# Patient Record
Sex: Male | Born: 1935 | Race: White | Hispanic: No | Marital: Married | Smoking: Former smoker
Health system: Southern US, Community
[De-identification: ages and names within clinical notes are randomized; demographics above are authoritative.]

## PROBLEM LIST (undated history)

## (undated) DIAGNOSIS — M1990 Unspecified osteoarthritis, unspecified site: Secondary | ICD-10-CM

## (undated) DIAGNOSIS — E785 Hyperlipidemia, unspecified: Secondary | ICD-10-CM

## (undated) DIAGNOSIS — M545 Low back pain, unspecified: Secondary | ICD-10-CM

## (undated) DIAGNOSIS — G629 Polyneuropathy, unspecified: Secondary | ICD-10-CM

## (undated) DIAGNOSIS — F411 Generalized anxiety disorder: Secondary | ICD-10-CM

## (undated) DIAGNOSIS — F32A Depression, unspecified: Secondary | ICD-10-CM

## (undated) DIAGNOSIS — I10 Essential (primary) hypertension: Secondary | ICD-10-CM

## (undated) DIAGNOSIS — M79605 Pain in left leg: Secondary | ICD-10-CM

## (undated) DIAGNOSIS — K219 Gastro-esophageal reflux disease without esophagitis: Secondary | ICD-10-CM

## (undated) DIAGNOSIS — G8929 Other chronic pain: Secondary | ICD-10-CM

## (undated) DIAGNOSIS — M79604 Pain in right leg: Secondary | ICD-10-CM

## (undated) HISTORY — DX: Low back pain: M54.5

## (undated) HISTORY — DX: Low back pain, unspecified: M54.50

## (undated) HISTORY — DX: Unspecified osteoarthritis, unspecified site: M19.90

## (undated) HISTORY — DX: Essential (primary) hypertension: I10

## (undated) HISTORY — PX: NOSE SURGERY: SHX723

## (undated) HISTORY — PX: EXTERNAL EAR SURGERY: SHX627

## (undated) HISTORY — DX: Gastro-esophageal reflux disease without esophagitis: K21.9

## (undated) HISTORY — DX: Hyperlipidemia, unspecified: E78.5

## (undated) HISTORY — PX: DECUBITUS ULCER EXCISION: SHX1443

## (undated) HISTORY — DX: Other chronic pain: G89.29

## (undated) HISTORY — PX: HEMORRHOID SURGERY: SHX153

## (undated) HISTORY — PX: CERVICAL FUSION: SHX112

## (undated) HISTORY — DX: Pain in right leg: M79.604

## (undated) HISTORY — DX: Polyneuropathy, unspecified: G62.9

## (undated) HISTORY — DX: Generalized anxiety disorder: F41.1

## (undated) HISTORY — PX: KNEE SURGERY: SHX244

## (undated) HISTORY — PX: ROTATOR CUFF REPAIR: SHX139

## (undated) HISTORY — DX: Pain in left leg: M79.605

---

## 1999-09-29 ENCOUNTER — Emergency Department (HOSPITAL_COMMUNITY): Admission: EM | Admit: 1999-09-29 | Discharge: 1999-09-29 | Payer: Self-pay | Admitting: Emergency Medicine

## 1999-09-29 ENCOUNTER — Encounter: Payer: Self-pay | Admitting: Emergency Medicine

## 1999-10-11 ENCOUNTER — Encounter: Payer: Self-pay | Admitting: Family Medicine

## 1999-10-11 ENCOUNTER — Ambulatory Visit (HOSPITAL_COMMUNITY): Admission: RE | Admit: 1999-10-11 | Discharge: 1999-10-11 | Payer: Self-pay | Admitting: Family Medicine

## 1999-10-21 ENCOUNTER — Encounter: Payer: Self-pay | Admitting: Neurosurgery

## 1999-10-21 ENCOUNTER — Encounter: Admission: RE | Admit: 1999-10-21 | Discharge: 1999-10-21 | Payer: Self-pay | Admitting: Neurosurgery

## 2000-02-03 ENCOUNTER — Ambulatory Visit (HOSPITAL_COMMUNITY): Admission: RE | Admit: 2000-02-03 | Discharge: 2000-02-03 | Payer: Self-pay | Admitting: Neurosurgery

## 2000-02-03 ENCOUNTER — Encounter: Payer: Self-pay | Admitting: Neurosurgery

## 2000-11-15 ENCOUNTER — Encounter: Payer: Self-pay | Admitting: Neurosurgery

## 2000-11-15 ENCOUNTER — Inpatient Hospital Stay (HOSPITAL_COMMUNITY): Admission: RE | Admit: 2000-11-15 | Discharge: 2000-11-17 | Payer: Self-pay | Admitting: Neurosurgery

## 2000-12-01 ENCOUNTER — Encounter: Admission: RE | Admit: 2000-12-01 | Discharge: 2000-12-01 | Payer: Self-pay | Admitting: Neurosurgery

## 2000-12-01 ENCOUNTER — Encounter: Payer: Self-pay | Admitting: Neurosurgery

## 2001-01-02 ENCOUNTER — Encounter: Payer: Self-pay | Admitting: Neurosurgery

## 2001-01-02 ENCOUNTER — Encounter: Admission: RE | Admit: 2001-01-02 | Discharge: 2001-01-02 | Payer: Self-pay | Admitting: Neurosurgery

## 2001-08-07 ENCOUNTER — Encounter: Payer: Self-pay | Admitting: Specialist

## 2001-08-07 ENCOUNTER — Ambulatory Visit (HOSPITAL_COMMUNITY): Admission: RE | Admit: 2001-08-07 | Discharge: 2001-08-07 | Payer: Self-pay | Admitting: Specialist

## 2001-08-21 ENCOUNTER — Inpatient Hospital Stay (HOSPITAL_COMMUNITY): Admission: RE | Admit: 2001-08-21 | Discharge: 2001-08-22 | Payer: Self-pay | Admitting: Specialist

## 2002-08-15 ENCOUNTER — Encounter: Payer: Self-pay | Admitting: Specialist

## 2002-08-15 ENCOUNTER — Ambulatory Visit (HOSPITAL_COMMUNITY): Admission: RE | Admit: 2002-08-15 | Discharge: 2002-08-15 | Payer: Self-pay | Admitting: Specialist

## 2002-10-01 ENCOUNTER — Encounter: Payer: Self-pay | Admitting: Specialist

## 2002-10-08 ENCOUNTER — Ambulatory Visit (HOSPITAL_COMMUNITY): Admission: RE | Admit: 2002-10-08 | Discharge: 2002-10-08 | Payer: Self-pay | Admitting: Specialist

## 2002-10-08 ENCOUNTER — Encounter (INDEPENDENT_AMBULATORY_CARE_PROVIDER_SITE_OTHER): Payer: Self-pay | Admitting: Specialist

## 2006-04-15 ENCOUNTER — Ambulatory Visit: Payer: Self-pay | Admitting: Gastroenterology

## 2006-04-29 ENCOUNTER — Encounter (INDEPENDENT_AMBULATORY_CARE_PROVIDER_SITE_OTHER): Payer: Self-pay | Admitting: Specialist

## 2006-04-29 ENCOUNTER — Ambulatory Visit: Payer: Self-pay | Admitting: Gastroenterology

## 2006-04-29 ENCOUNTER — Encounter (INDEPENDENT_AMBULATORY_CARE_PROVIDER_SITE_OTHER): Payer: Self-pay | Admitting: *Deleted

## 2009-10-13 ENCOUNTER — Encounter (INDEPENDENT_AMBULATORY_CARE_PROVIDER_SITE_OTHER): Payer: Self-pay | Admitting: *Deleted

## 2009-10-13 ENCOUNTER — Emergency Department (HOSPITAL_BASED_OUTPATIENT_CLINIC_OR_DEPARTMENT_OTHER): Admission: EM | Admit: 2009-10-13 | Discharge: 2009-10-13 | Payer: Self-pay | Admitting: Emergency Medicine

## 2009-10-13 ENCOUNTER — Ambulatory Visit: Payer: Self-pay | Admitting: Diagnostic Radiology

## 2009-10-20 ENCOUNTER — Encounter (INDEPENDENT_AMBULATORY_CARE_PROVIDER_SITE_OTHER): Payer: Self-pay | Admitting: *Deleted

## 2009-10-31 ENCOUNTER — Encounter (INDEPENDENT_AMBULATORY_CARE_PROVIDER_SITE_OTHER): Payer: Self-pay | Admitting: *Deleted

## 2009-10-31 ENCOUNTER — Ambulatory Visit: Payer: Self-pay | Admitting: Gastroenterology

## 2009-10-31 DIAGNOSIS — M1990 Unspecified osteoarthritis, unspecified site: Secondary | ICD-10-CM | POA: Insufficient documentation

## 2009-10-31 DIAGNOSIS — R197 Diarrhea, unspecified: Secondary | ICD-10-CM

## 2009-10-31 DIAGNOSIS — K219 Gastro-esophageal reflux disease without esophagitis: Secondary | ICD-10-CM

## 2009-10-31 DIAGNOSIS — K5730 Diverticulosis of large intestine without perforation or abscess without bleeding: Secondary | ICD-10-CM | POA: Insufficient documentation

## 2009-11-17 ENCOUNTER — Ambulatory Visit: Payer: Self-pay | Admitting: Gastroenterology

## 2009-11-20 ENCOUNTER — Encounter: Payer: Self-pay | Admitting: Gastroenterology

## 2009-12-01 ENCOUNTER — Telehealth: Payer: Self-pay | Admitting: Gastroenterology

## 2009-12-01 ENCOUNTER — Ambulatory Visit: Payer: Self-pay | Admitting: Internal Medicine

## 2009-12-01 DIAGNOSIS — K2990 Gastroduodenitis, unspecified, without bleeding: Secondary | ICD-10-CM

## 2009-12-01 DIAGNOSIS — R109 Unspecified abdominal pain: Secondary | ICD-10-CM

## 2009-12-01 DIAGNOSIS — K2970 Gastritis, unspecified, without bleeding: Secondary | ICD-10-CM | POA: Insufficient documentation

## 2009-12-01 DIAGNOSIS — F411 Generalized anxiety disorder: Secondary | ICD-10-CM | POA: Insufficient documentation

## 2010-05-28 NOTE — Procedures (Signed)
Summary: Colon and path   Colonoscopy  Procedure date:  04/29/2006  Findings:      Location:  [facility].    Colonoscopy  Procedure date:  04/29/2006  Findings:      Location:  [facility].   Patient Name: [name], [name] MRN:  Procedure Procedures: Colonoscopy CPT: 04540.  Personnel: Endoscopist: Ulyess Mort, MD.  Exam Location: Exam performed in Outpatient Clinic. Outpatient  Patient Consent: Procedure, Alternatives, Risks and Benefits discussed, consent obtained, from patient. Consent was obtained by the RN.  Indications  Surveillance of: Adenomatous Polyp(s).  History  Current Medications: Patient is not currently taking Coumadin.  Pre-Exam Physical: Performed Sep 04, 2002. Cardio-pulmonary exam, Rectal exam, HEENT exam , Abdominal exam, Extremity exam, Mental status exam WNL.  Comments: Pt. history reviewed/updated, physical exam performed prior to initiation of sedation? Exam Exam: Extent of exam reached: Cecum, extent intended: Cecum.  The cecum was identified by appendiceal orifice and IC valve. Colon retroflexion performed. Images taken. ASA Classification: II. Tolerance: good.  Monitoring: Pulse and BP monitoring, Oximetry used. Supplemental O2 given.  Sedation Meds: Patient assessed and found to be appropriate for moderate (conscious) sedation. Fentanyl given IV. Versed given IV.  Findings - DIVERTICULOSIS: Transverse Colon to Sigmoid Colon. ICD9: Diverticulosis, Colon: 562.10. Comments: mild-mod.  POLYP: Descending Colon, Maximum size: 4 mm. sessile polyp. Procedure:  hot biopsy, removed, retrieved, Polyp sent to pathology. ICD9: Colon Polyps: 211.3.  - HEMORRHOIDS: External. Size: Grade I. ICD9: Hemorrhoids, External: 455.3.   Assessment Abnormal examination, see findings above.  Diagnoses: 211.3: Colon Polyps.  562.10: Diverticulosis, Colon.  455.3: Hemorrhoids, External.   Events  Unplanned  Interventions: No intervention was required.  Unplanned Events: There were no complications. Plans Medication Plan: Continue current medications.  Patient Education: Patient given standard instructions for: Polyps. Diverticulosis. Hemorrhoids. Patient instructed to get routine colonoscopy every 4 years.  Disposition: After procedure patient sent to recovery. After recovery patient sent home.    cc; [name], MD      [facility] Surgical Pathology - STATUS: Final             By: Threasa Beards  ,        Perform Date: 4 Jan08 00:01  Ordered By: Dorene Ar,        Ordered Date: 7 Jan08 12:43  Facility: [facility]                               Department: CPATH  Service Report Text  [facility]   [address] [address]   [place], [place] [address]   Telephone [phone] or [phone] Fax [phone]    REPORT OF SURGICAL PATHOLOGY    Case #: [id number]   Patient Name: [name].   Office Chart Number: [id number]    MRN: [id number]   Pathologist: Havery Moros, MD   DOB/Age 04/24/1936 (Age: 75) Gender: M   Date Taken: 04/29/2006   Date Received: 05/02/2006    FINAL DIAGNOSIS    ***MICROSCOPIC EXAMINATION AND DIAGNOSIS***    DESCENDING COLON, POLYP(S): ADENOMATOUS POLYP(S). NO HIGH GRADE   DYSPLASIA OR INVASIVE MALIGNANCY IDENTIFIED.    mw   Date Reported: 05/03/2006 Havery Moros, MD   *** Electronically Signed Out By BNS ***    Clinical information   R/O adenoma (caf)    specimen(s) obtained   Colon, polyp(s), descending  Gross Description   Received in formalin is a tan, soft tissue fragment that is   submitted in toto. Size: 0.3 cm (TB:mj 05/02/06)    mj/    This report was created from the original endoscopy report, which was reviewed and signed by the above listed endoscopist.

## 2010-05-28 NOTE — Letter (Signed)
Summary: New Patient letter  [name]  [address] [place], [place] [address]   Phone: [phone]  Fax: [phone]       10/20/2009 MRN: [id number]  [name] [address] [place], [place]  [address]  Dear [name],  Welcome to the Gastroenterology Division at [facility].    You are scheduled to see Dr.  Sheryn Bison on October 31, 2009 at 9:30am on the 3rd floor at [facility], [address]  We ask that you try to arrive at our office 15 minutes prior to your appointment time to allow for check-in.  We would like you to complete the enclosed self-administered evaluation form prior to your visit and bring it with you on the day of your appointment.  We will review it with you.  Also, please bring a complete list of all your medications or, if you prefer, bring the medication bottles and we will list them.  Please bring your insurance card so that we may make a copy of it.  If your insurance requires a referral to see a specialist, please bring your referral form from your primary care physician.  Co-payments are due at the time of your visit and may be paid by cash, check or credit card.     Your office visit will consist of a consult with your physician (includes a physical exam), any laboratory testing he/she may order, scheduling of any necessary diagnostic testing (e.g. x-ray, ultrasound, CT-scan), and scheduling of a procedure (e.g. Endoscopy, Colonoscopy) if required.  Please allow enough time on your schedule to allow for any/all of these possibilities.    If you cannot keep your appointment, please call [phone] to cancel or reschedule prior to your appointment date.  This allows [place] the opportunity to schedule an appointment for another patient in need of care.  If you do not cancel or reschedule by 5 p.m. the business day prior to your appointment date, you will be charged a $50.00 late cancellation/no-show fee.    Thank you for  choosing [facility] for your medical needs.  We appreciate the opportunity to care for you.  Please visit [place] at our website  to learn more about our practice.                     Sincerely,                                                             The Gastroenterology Division

## 2010-05-28 NOTE — Letter (Signed)
Summary: Patient Notice- Polyp Results  [facility]  [address] [place], [place] [address]   Phone: [phone]  Fax: [phone]        November 20, 2009 MRN: [id number]    [name] [address] [place], [place]  [address]    Dear [name],  I am pleased to inform you that the colon polyp(s) removed during your recent colonoscopy was (were) found to be benign (no cancer detected) upon pathologic examination.  I recommend you have a repeat colonoscopy examination in 5_ years to look for recurrent polyps, as having colon polyps increases your risk for having recurrent polyps or even colon cancer in the future.  Should you develop new or worsening symptoms of abdominal pain, bowel habit changes or bleeding from the rectum or bowels, please schedule an evaluation with either your primary care physician or with me.  Additional information/recommendations:  __ No further action with gastroenterology is needed at this time. Please      follow-up with your primary care physician for your other healthcare      needs.  __ Please call [phone] to schedule a return visit to review your      situation.  __ Please keep your follow-up visit as already scheduled.  x__ Continue treatment plan as outlined the day of your exam.  Please call us if you are having persistent problems or have questions about your condition that have not been fully answered at this time.  Sincerely,  Mardella Layman MD [facility]  This letter has been electronically signed by your physician.  Appended Document: Patient Notice- Polyp Results letter mailed 8.1.2011

## 2010-05-28 NOTE — Letter (Signed)
Summary: [facility] Instructions  [facility]  [address] [place], [place] [address]   Phone: [phone]  Fax: [phone]       [name]    07/03/1935    MRN: [id number]        Procedure Day /Date: Monday, 11/17/09     Arrival Time: 1:00      Procedure Time: 2:00     Location of Procedure:                    [facility] (4th Floor)   PREPARATION FOR COLONOSCOPY WITH MOVIPREP   Starting 5 days prior to your procedure 11/12/09 do not eat nuts, seeds, popcorn, corn, beans, peas,  salads, or any raw vegetables.  Do not take any fiber supplements (e.g. Metamucil, Citrucel, and Benefiber).  THE DAY BEFORE YOUR PROCEDURE         DATE: 11/16/09    DAY: Sunday  1.  Drink clear liquids the entire day-NO SOLID FOOD  2.  Do not drink anything colored red or purple.  Avoid juices with pulp.  No orange juice.  3.  Drink at least 64 oz. (8 glasses) of fluid/clear liquids during the day to prevent dehydration and help the prep work efficiently.  CLEAR LIQUIDS INCLUDE: Water Jello Ice Popsicles Tea (sugar ok, no milk/cream) Powdered fruit flavored drinks Coffee (sugar ok, no milk/cream) Gatorade Juice: apple, white grape, white cranberry  Lemonade Clear bullion, consomm, broth Carbonated beverages (any kind) Strained chicken noodle soup Hard Candy                             4.  In the morning, mix first dose of MoviPrep solution:    Empty 1 Pouch A and 1 Pouch B into the disposable container    Add lukewarm drinking water to the top line of the container. Mix to dissolve    Refrigerate (mixed solution should be used within 24 hrs)  5.  Begin drinking the prep at 5:00 p.m. The MoviPrep container is divided by 4 marks.   Every 15 minutes drink the solution down to the next mark (approximately 8 oz) until the full liter is complete.   6.  Follow completed prep with 16 oz of clear liquid of your choice (Nothing red or purple).  Continue to  drink clear liquids until bedtime.  7.  Before going to bed, mix second dose of MoviPrep solution:    Empty 1 Pouch A and 1 Pouch B into the disposable container    Add lukewarm drinking water to the top line of the container. Mix to dissolve    Refrigerate  THE DAY OF YOUR PROCEDURE      DATE: 11/17/09   DAY: Monday  Beginning at 9:00 a.m. (5 hours before procedure):         1. Every 15 minutes, drink the solution down to the next mark (approx 8 oz) until the full liter is complete.  2. Follow completed prep with 16 oz. of clear liquid of your choice.    3. You may drink clear liquids until 12:00 (2 HOURS BEFORE PROCEDURE).   MEDICATION INSTRUCTIONS  Unless otherwise instructed, you should take regular prescription medications with a small sip of water   as early as possible the morning of your procedure.               OTHER INSTRUCTIONS  You will need a responsible adult at least 73 years of age to accompany you and drive you home.   This person must remain in the waiting room during your procedure.  Wear loose fitting clothing that is easily removed.  Leave jewelry and other valuables at home.  However, you may wish to bring a book to read or  an iPod/MP3 player to listen to music as you wait for your procedure to start.  Remove all body piercing jewelry and leave at home.  Total time from sign-in until discharge is approximately 2-3 hours.  You should go home directly after your procedure and rest.  You can resume normal activities the  day after your procedure.  The day of your procedure you should not:   Drive   Make legal decisions   Operate machinery   Drink alcohol   Return to work  You will receive specific instructions about eating, activities and medications before you leave.    The above instructions have been reviewed and explained to me by   _______________________    I fully understand and can verbalize these instructions  _____________________________ Date _________

## 2010-05-28 NOTE — Miscellaneous (Signed)
Summary: Orders Update/clotest  Clinical Lists Changes  Orders: Added new Test order of TLB-H Pylori Screen Gastric Biopsy (83013-CLOTEST) - Signed 

## 2010-05-28 NOTE — Progress Notes (Signed)
Summary: Abd Pain  Phone Note Call from Patient Call back at Home Phone [phone]   Call For: Dr Jarold Motto Reason for Call: Talk to Nurse Summary of Call: Abd Pain since friday will not go away Initial call taken by: Leanor Kail [facility],  December 01, 2009 10:26 AM  Follow-up for Phone Call        Pt c/o RUQ pain since Friday.  Crampy pain, severe at times.  OV sch for pt to see Rozetta Nunnery this pm. Follow-up by: Ashok Cordia RN,  December 01, 2009 10:39 AM

## 2010-05-28 NOTE — Letter (Signed)
Summary: Patient Notice-Endo Biopsy Results  [facility]  [address] [place], [place] [address]   Phone: [phone]  Fax: [phone]        November 20, 2009 MRN: [id number]    [name] [address] [place], [place]  [address]    Dear Mr. OSORIA,  I am pleased to inform you that the biopsies taken during your recent endoscopic examination did not show any evidence of cancer upon pathologic examination.  Additional information/recommendations:  __No further action is needed at this time.  Please follow-up with      your primary care physician for your other healthcare needs.  __ Please call [phone] to schedule a return visit to review      your condition.  __ Continue with the treatment plan as outlined on the day of your      exam.Prevpac called for helicobacter Rx.and chronic gastritis.  __ You should have a repeat endoscopic examination for this problem              in _ months/years.   Please call us if you are having persistent problems or have questions about your condition that have not been fully answered at this time.  Sincerely,  Mardella Layman MD [facility]  This letter has been electronically signed by your physician.  Appended Document: Patient Notice-Endo Biopsy Results letter mailed 8.1.2011

## 2010-05-28 NOTE — Assessment & Plan Note (Signed)
Summary: ABD PAIN, CHRONIC DARRHEA...EM   History of Present Illness Visit Type: consult  Primary GI MD: Sheryn Bison MD FACP FAGA Primary Provider: [name], MD Requesting Provider: [name], MD Chief Complaint: Lower abd pain, constipation, rectal bleeding, and GERD History of Present Illness:   75 year old Caucasian male who had seen the past for screening colonoscopy with removal of some hyperplastic polyps. Also that time he had mild left colon diverticulosis.  He is referred now for evaluation one month of crampy lower abdominal pain with loose watery diarrhea, all of which has resolved with Lomotil therapy. In fact, he is now constipated and has not had a bowel movement in 3 days.  Patient initially had right upper quadrant pain, was seen by primary care, and had a negative ultrasound of his abdomen. He subsequent developed crampy diffuse abdominal pain and watery nonbloody diarrhea and was seen in emergency room where he had negative labs, examination, CT scan, and multiple stool exams are all negative except for positive leukocytes in his stool. There are voluminous records that were reviewed today and in some detail but which is very time-consuming completing his radiographs. Liver function tests are normal except for total bilirubin of 1.6 mg percent. There was no evidence of leukocytosis. CT Scan did show diverticulosis. Chest x-ray, EKG, and cardiac enzymes also were normal.  The patient is a low fiber diet at this time but denies dietary sensitivities. He denies chronic diarrhea, systemic complaints such as fever, chills, or skin rash, he also currently denies upper GI or hepatobiliary symptomatology. He has not been on any antibiotics in the last year, but his wife was sick earlier this spring with a myocardial infarction, and apparently he visited her multiple times in the hospital.There is a history of DJD and hyperlipidemia but no NSAID use. He is on aspirin 81 mg a day. He  currently is on Prilosec for periodic acid reflux, but he denies dysphasia or active symptomatology. For his DJD he has had previous knee surgery and neck surgery.   GI Review of Systems    Reports abdominal pain, acid reflux, and  heartburn.     Location of  Abdominal pain: lower abdomen.    Denies belching, bloating, chest pain, dysphagia with liquids, dysphagia with solids, loss of appetite, nausea, vomiting, vomiting blood, weight loss, and  weight gain.      Reports constipation, diarrhea, diverticulosis, rectal bleeding, and  rectal pain.     Denies anal fissure, black tarry stools, change in bowel habit, fecal incontinence, heme positive stool, hemorrhoids, irritable bowel syndrome, jaundice, light color stool, and  liver problems.    Current Medications (verified): 1)  Aspirin 81 Mg Tbec (Aspirin) .... One Tablet By Mouth Once Daily(On Hold) 2)  Metoprolol Tartrate 50 Mg Tabs (Metoprolol Tartrate) .... Two Times A Day 3)  Lovaza 1 Gm Caps (Omega-3-Acid Ethyl Esters) .... 2 Tabs Two Times A Day 4)  Pravachol 20 Mg Tabs (Pravastatin Sodium) .... One Tablet By Mouth Once Daily 5)  Diazepam 5 Mg Tabs (Diazepam) .... One Tablet By Mouth Three Times A Day 6)  Prilosec Otc 20 Mg Tbec (Omeprazole Magnesium) .... One Tablet By Mouth Once Daily 7)  Pepcid Ac 10 Mg Tabs (Famotidine) .... One Tablet By Mouth Once Daily  Allergies (verified): 1)  ! Prednisone  Past History:  Past medical, surgical, family and social histories (including risk factors) reviewed for relevance to current acute and chronic problems.  Past Medical History: Anxiety Disorder Arthritis Hyperlipidemia  Anal Fissure  Colon Polyps Diverticulosis Esophageal Stricture  Past Surgical History: Neck Surgery Right Knee Surgery x 3  Shoulder Surgery   Family History: Reviewed history and no changes required. No FH of Colon Cancer:  Social History: Reviewed history and no changes required. Retired Married One  child Alcohol Use - no Daily Caffeine Use: one daily  Illicit Drug Use - no Drug Use:  no  Review of Systems       The patient complains of back pain, fatigue, and hearing problems.  The patient denies allergy/sinus, anemia, anxiety-new, arthritis/joint pain, blood in urine, breast changes/lumps, change in vision, confusion, cough, coughing up blood, depression-new, fainting, fever, headaches-new, heart murmur, heart rhythm changes, itching, menstrual pain, muscle pains/cramps, night sweats, nosebleeds, pregnancy symptoms, shortness of breath, skin rash, sleeping problems, sore throat, swelling of feet/legs, swollen lymph glands, thirst - excessive , urination - excessive , urination changes/pain, urine leakage, vision changes, and voice change.   General:  Denies fever, chills, sweats, anorexia, fatigue, weakness, malaise, weight loss, and sleep disorder. Eyes:  Denies blurring, diplopia, irritation, discharge, vision loss, scotoma, eye pain, and photophobia. ENT:  Complains of decreased hearing; denies earache, ear discharge, tinnitus, nasal congestion, loss of smell, nosebleeds, sore throat, hoarseness, and difficulty swallowing. CV:  Denies chest pains, angina, palpitations, syncope, dyspnea on exertion, orthopnea, PND, peripheral edema, and claudication. Resp:  Denies dyspnea at rest, dyspnea with exercise, cough, sputum, wheezing, coughing up blood, and pleurisy. GI:  Complains of constipation; denies difficulty swallowing, pain on swallowing, nausea, indigestion/heartburn, vomiting, vomiting blood, abdominal pain, jaundice, gas/bloating, diarrhea, change in bowel habits, bloody BM's, black BMs, and fecal incontinence. GU:  Denies urinary burning, blood in urine, urinary frequency, urinary hesitancy, nocturnal urination, urinary incontinence, penile discharge, genital sores, decreased libido, and erectile dysfunction. MS:  Complains of joint swelling, joint stiffness, and joint deformity;  denies joint pain / LOM, low back pain, muscle weakness, muscle cramps, muscle atrophy, leg pain at night, leg pain with exertion, and shoulder pain / LOM hand / wrist pain (CTS). Derm:  Denies rash, itching, dry skin, hives, moles, warts, and unhealing ulcers. Neuro:  Denies weakness, paralysis, abnormal sensation, seizures, syncope, tremors, vertigo, transient blindness, frequent falls, frequent headaches, difficulty walking, headache, sciatica, radiculopathy other:, restless legs, memory loss, and confusion. Psych:  Complains of anxiety; denies depression, memory loss, suicidal ideation, hallucinations, paranoia, phobia, and confusion. Endo:  Denies cold intolerance, heat intolerance, polydipsia, polyphagia, polyuria, unusual weight change, and hirsutism. Heme:  Denies bruising, bleeding, enlarged lymph nodes, and pagophagia. Allergy:  Denies hives, rash, sneezing, hay fever, and recurrent infections.  Vital Signs:  Patient profile:   75 year old male Height:      74 inches Weight:      259 pounds BMI:     33.37 BSA:     2.43 Pulse rate:   76 / minute Pulse rhythm:   regular BP sitting:   140 / 82  (left arm) Cuff size:   regular  Vitals Entered By: Ok Anis CMA (October 31, 2009 9:31 AM)  Physical Exam  General:  Well developed, well nourished, no acute distress.healthy appearing.   Head:  Normocephalic and atraumatic. Eyes:  PERRLA, no icterus.exam deferred to patient's ophthalmologist.   Neck:  Supple; no masses or thyromegaly. Lungs:  Clear throughout to auscultation. Heart:  Regular rate and rhythm; no murmurs, rubs,  or bruits. Abdomen:  Soft, nontender and nondistended. No masses, hepatosplenomegaly or hernias noted. Normal bowel sounds. Rectal:  Normal exam.hemocult negative.  normal color formed guaiac-negative stool. Prostate:  .normal size prostate.   Msk:  Symmetrical with no gross deformities. Normal posture.arthritic changes.  Swelling of both knees without effusions  or increased warmth or tenderness. There is no evidence of phlebitis. Pulses:  Normal pulses noted. Extremities:  No clubbing, cyanosis, edema or deformities noted. Neurologic:  Alert and  oriented x4;  grossly normal neurologically. Cervical Nodes:  No significant cervical adenopathy. Inguinal Nodes:  No significant inguinal adenopathy. Psych:  Alert and cooperative. Normal mood and affect.   Impression & Recommendations:  Problem # 1:  DIARRHEA (ICD-787.91) Assessment Improved Possible resolving viral or bacterial gastroenteritis versus low-grade diverticulitis. As mentioned above complete GI workup with stool exams, labs, ultrasound and CT scans have all been normal. Patient is currently asymptomatic using p.r.n. Lomotil. We will slowly advance his diet as tolerated and proceed with colonoscopy in several weeks time. Have asked him to discontinue Lomotil use at this time but to call if he has a relapse of his problems.Again, multiple x-rays and records reviewed today with the examination the patient with his wife and presents. Large amount of time spent reviewing his records and reviewing his problems.C. difficile toxin assay also was negative.  Problem # 2:  DIVERTICULOSIS-COLON (ICD-562.10) Assessment: Unchanged Advance fiber in his diet slowly as tolerated.  Problem # 3:  GERD (ICD-530.81) Assessment: Improved chronic GERD with chronic PPI therapy. I cannot series had previous endoscopy and we will schedule this with his colonoscopy. Review of his chart record shows chronic acid reflux problems with a history of previous stricture and dilation. He needs screening to rule out Barrett's mucosa.  Problem # 4:  DEGENERATIVE JOINT DISEASE (ICD-715.90) Assessment: Unchanged patient advised to avoid NSAIDs if possible. It is of note that he is on daily aspirin therapy.  Patient Instructions: 1)  Please continue current medications.  2)  You are scheduled for a colonoscopy. 3)  The  medication list was reviewed and reconciled.  All changed / newly prescribed medications were explained.  A complete medication list was provided to the patient / caregiver. 4)  Copy sent to : Dr. [name] 5)  Please continue current medications.  6)  Constipation and Hemorrhoids brochure given.  7)  Colonoscopy and Flexible Sigmoidoscopy brochure given.  8)  Upper Endoscopy brochure given.   Appended Document: ABD PAIN, CHRONIC DARRHEA...EM    Clinical Lists Changes  Medications: Added new medication of MOVIPREP 100 GM  SOLR (PEG-KCL-NACL-NASULF-NA ASC-C) As per prep instructions. - Signed Rx of MOVIPREP 100 GM  SOLR (PEG-KCL-NACL-NASULF-NA ASC-C) As per prep instructions.;  #1 x 0;  Signed;  Entered by: Ashok Cordia RN;  Authorized by: Mardella Layman MD [facility];  Method used: Electronically to [facility] [address] [place], [place], [place]  [address], Ph: [phone], Fax: [phone] Orders: Added new Test order of Colon/Endo (Colon/Endo) - Signed    Prescriptions: MOVIPREP 100 GM  SOLR (PEG-KCL-NACL-NASULF-NA ASC-C) As per prep instructions.  #1 x 0   Entered by:   Ashok Cordia RN   Authorized by:   Mardella Layman MD [facility]   Signed by:   Ashok Cordia RN on 10/31/2009   Method used:   Electronically to        [facility]* (retail)       [address]       [place], [place]  [address]  Ph: [phone]       Fax: [phone]   RxID:   [id number]

## 2010-05-28 NOTE — Procedures (Signed)
Summary: Upper Endoscopy  Patient: [name] Note: All result statuses are Final unless otherwise noted.  Tests: (1) Upper Endoscopy (EGD)   EGD Upper Endoscopy       DONE     [facility]     [address]     [place], [place]  [address]           ENDOSCOPY PROCEDURE REPORT           PATIENT:  [name], [name]  MR#:  [id number]     BIRTHDATE:  November 12, 1935, 74 yrs. old  GENDER:  male           ENDOSCOPIST:  Vania Rea. Jarold Motto, MD, [facility]     Referred by:           PROCEDURE DATE:  11/17/2009     PROCEDURE:  EGD with biopsy     ASA CLASS:  Class II     INDICATIONS:  GERD           MEDICATIONS:   There was residual sedation effect present from     prior procedure., Fentanyl 25 mcg IV, Versed 2 mg IV     TOPICAL ANESTHETIC:  Exactacain Spray           DESCRIPTION OF PROCEDURE:   After the risks benefits and     alternatives of the procedure were thoroughly explained, informed     consent was obtained.  The LB GIF-H180 [id number] endoscope was     introduced through the mouth and advanced to the second portion of     the duodenum, without limitations.  The instrument was slowly     withdrawn as the mucosa was fully examined.     <<PROCEDUREIMAGES>>           A stricture was found in the distal esophagus. It was     circumferential. Dilation with maloney dilator 18mm TOLERATED     WELL.  Moderate gastritis was found in the body and the antrum of     the stomach. CLO AND REGULAR BIOPSIES DONE.MILD DUODENITIS.     Retroflexed views revealed no abnormalities.    The scope was then     withdrawn from the patient and the procedure completed.           COMPLICATIONS:  None           ENDOSCOPIC IMPRESSION:     1) Stricture in the distal esophagus     2) Moderate gastritis in the body and the antrum of the stomach           1.R/O H.PYLORI     2.CHRONIC GERD AND STRICTURE DILATED.     RECOMMENDATIONS:     1) Await biopsy results     2) Clear liquids until, then soft foods rest  iof day. Resume     prior diet tomorrow.     3) Rx CLO if positive     4) continue current medications           REPEAT EXAM:  No           ______________________________     Vania Rea. Jarold Motto, MD, [facility]           CC:  [name], MD           n.     [name] at 11/17/2009 02:46 PM           [name], [phone]  Note: An  exclamation mark (!) indicates a result that was not dispersed into the flowsheet. Document Creation Date: 11/17/2009 2:47 PM _______________________________________________________________________  (1) Order result status: Final Collection or observation date-time: 11/17/2009 14:35 Requested date-time:  Receipt date-time:  Reported date-time:  Referring Physician:   Ordering Physician: Sheryn Bison [phone]) Specimen Source:  Source: Launa Grill Order Number: [id number] Lab site:

## 2010-05-28 NOTE — Procedures (Signed)
Summary: Colonoscopy  Patient: [name] Note: All result statuses are Final unless otherwise noted.  Tests: (1) Colonoscopy (COL)   COL Colonoscopy           DONE     [facility]     [address]     [place], [place]  [address]           COLONOSCOPY PROCEDURE REPORT           PATIENT:  [name], [name]  MR#:  [id number]     BIRTHDATE:  [birth year], 74 yrs. old  GENDER:  male     ENDOSCOPIST:  Vania Rea. Jarold Motto, MD, [facility]     REF. BY:     PROCEDURE DATE:  11/17/2009     PROCEDURE:  Colonoscopy with snare polypectomy     ASA CLASS:  Class II     INDICATIONS:  history of pre-cancerous (adenomatous) colon polyps,     unexplained diarrhea     MEDICATIONS:   Fentanyl 75 mcg IV, Versed 8 mg IV           DESCRIPTION OF PROCEDURE:   After the risks benefits and     alternatives of the procedure were thoroughly explained, informed     consent was obtained.  Digital rectal exam was performed and     revealed no abnormalities.   The LB CF-[id number] endoscope     was introduced through the anus and advanced to the cecum, which     was identified by both the appendix and ileocecal valve, without     limitations.  The quality of the prep was excellent, using     MoviPrep.  The instrument was then slowly withdrawn as the colon     was fully examined.     <<PROCEDUREIMAGES>>           FINDINGS:  Severe diverticulosis was found in the sigmoid to     descending colon segments.  A sessile polyp was found in the     proximal transverse colon. 1 CM FLAT POLYP HOT SNARE EXCISED     Retroflexed views in the rectum revealed no abnormalities.  RANDOM     LEFT COLON BIOPSIES DONE.  The scope was then withdrawn from the     patient and the procedure completed.           COMPLICATIONS:  None     ENDOSCOPIC IMPRESSION:     1) Severe diverticulosis in the sigmoid to descending colon     segments     2) Sessile polyp in the proximal transverse colon     R/O MICROSCOPIC/COLLAGENOUS  COLITIS.     RECOMMENDATIONS:     1) Follow up colonoscopy in 5 years     2) Await pathology results     REPEAT EXAM:  No           ______________________________     Vania Rea. Jarold Motto, MD, [facility]           CC:  [name], MD           n.     Rosalie DoctorVania Rea. Maximos Zayas at 11/17/2009 02:29 PM           [name], [id number]  Note: An exclamation mark (!) indicates a result that was not dispersed into the flowsheet. Document Creation Date: 11/17/2009 2:30 PM _______________________________________________________________________  (1) Order result status: Final Collection or observation date-time: 11/17/2009 14:22 Requested date-time:  Receipt date-time:  Reported date-time:  Referring Physician:   Ordering Physician: Sheryn Bison [phone]) Specimen Source:  Source: Launa Grill Order Number: [id number] Lab site:   Appended Document: Colonoscopy 5y/u  Appended Document: Colonoscopy     Procedures Next Due Date:    Colonoscopy: 10/2014
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
## 2010-07-12 LAB — COMPREHENSIVE METABOLIC PANEL
ALT: 27 U/L (ref 0–53)
Albumin: 4.4 g/dL (ref 3.5–5.2)
CO2: 24 mEq/L (ref 19–32)
Chloride: 108 mEq/L (ref 96–112)
Creatinine, Ser: 1 mg/dL (ref 0.4–1.5)
Glucose, Bld: 89 mg/dL (ref 70–99)
Sodium: 144 mEq/L (ref 135–145)

## 2010-07-12 LAB — PROTIME-INR: Prothrombin Time: 12.9 seconds (ref 11.6–15.2)

## 2010-07-12 LAB — DIFFERENTIAL
Basophils Relative: 1 % (ref 0–1)
Lymphocytes Relative: 23 % (ref 12–46)
Lymphs Abs: 1.6 10*3/uL (ref 0.7–4.0)
Monocytes Absolute: 0.7 10*3/uL (ref 0.1–1.0)
Monocytes Relative: 10 % (ref 3–12)
Neutro Abs: 4.6 10*3/uL (ref 1.7–7.7)
Neutrophils Relative %: 64 % (ref 43–77)

## 2010-07-12 LAB — URINALYSIS, ROUTINE W REFLEX MICROSCOPIC
Glucose, UA: NEGATIVE mg/dL
Hgb urine dipstick: NEGATIVE
Nitrite: NEGATIVE
Specific Gravity, Urine: 1.013 (ref 1.005–1.030)

## 2010-07-12 LAB — HEMOCCULT GUIAC POC 1CARD (OFFICE): Fecal Occult Bld: NEGATIVE

## 2010-07-12 LAB — URINE CULTURE: Culture: NO GROWTH

## 2010-07-12 LAB — CBC
Hemoglobin: 14.6 g/dL (ref 13.0–17.0)
MCHC: 34.1 g/dL (ref 30.0–36.0)
MCV: 96.4 fL (ref 78.0–100.0)

## 2010-07-12 LAB — POCT CARDIAC MARKERS
CKMB, poc: <1 ng/mL — ABNORMAL LOW (ref 1.0–8.0)
Myoglobin, poc: 78 ng/mL (ref 12–200)

## 2010-09-09 ENCOUNTER — Encounter: Payer: Self-pay | Admitting: Family Medicine

## 2010-09-11 NOTE — Op Note (Signed)
NAME:  [name], [name]                           ACCOUNT NO.:  [id number]   MEDICAL RECORD NO.:  [id number]                   PATIENT TYPE:  AMB   LOCATION:  DAY                                  FACILITY:  [facility]   PHYSICIAN:  Jene Every, M.D.                 DATE OF BIRTH:  [birth year]   DATE OF PROCEDURE:  10/08/2002  DATE OF DISCHARGE:                                 OPERATIVE REPORT   PREOPERATIVE DIAGNOSES:  1. Lateral meniscus tear, right knee.  2. Synovial cyst, right knee.   POSTOPERATIVE DIAGNOSES:  1. Medial meniscus tear of the right knee.  2. Chondromalacia of the patella.   PROCEDURE PERFORMED:  1. Right knee arthroscopy.  2. Partial medial meniscectomy.  3. Partial lateral meniscectomy.  4. Chondroplasty of the patella and femoral sulcus followed by open excision     of a synovial cyst of the right knee, separate incision, extra-articular.   ANESTHESIA:  General.   SURGEON:  Javier Docker, M.D.   BRIEF HISTORY AND INDICATIONS:  The patient is a 75 year old, refractory  knee pain and a mass of the right knee, designated as a lobular synovial  cyst from the lateral joint line, contiguous with a probable lateral  meniscus cyst.  It was aspirated in the office without avail.  MRI  indicating this lobular cyst, degenerative changes, as well as lateral  meniscus.  Operative intervention is indicated for arthroscopic evaluation,  debridement of the knee, addressing the lateral meniscus.  He did have pain  within the knee and swelling and open excision of the cyst.  Risks and  benefits discussed including bleeding, infection, damage to neurovascular  structures, recurrent cyst, suboptimal resection of cyst, etc.   TECHNIQUE:  The patient in supine position.  After induction of adequate  general anesthesia and 1 g of Kefzol, the right lower extremity was prepped  and draped in the usual sterile fashion.  A lateral parapatellar portal was  fashioned with a #11  blade after localization with an 18 gauge needle.  Superior and medial parapatellar portal was fashioned with a #11 blade.  Ingress cannula atraumatically placed.  Irrigant was utilized to insufflate  the joint.  Under direct visualization, medial parapatellar portal was  fashioned with a #11 blade, sparing the medial meniscus after localization  with an 18 gauge needle.  Inspection revealed extensive grade 3 changes of  the patella.  Perisynovial fluid was obtained.  Medially, there was a  posterior medial meniscus tear, and a basket rongeur was introduced and  utilized to perform partial medial meniscectomy to a stable base, further  contoured with the shavers.  Degenerative changes in the knee was debrided.  There was some hypertrophic synovium of the intercondylar notch.  This was  debrided as well.  There was a grade 3 change extensively of the patella.  Collene Mares was introduced, utilized to perform a chondroplasty  of the patella to  stable base.  There was normal patellofemoral tracking laterally.  There was  a lot of radial tears in the lateral meniscus.  Collene Mares was introduced and  utilized to shave this to a stable base.  Small basket rongeur was  introduced posteriorly to resect a tear to a stable base.  Looked above and  below the meniscus, we really could not find a conduit to the extraarticular  cyst.  Degenerative changes in the joint noted and performed generalized  debridement in the joint.  The gutters, medially and laterally were  unremarkable as well.   Next, the knee was copiously lavaged, reexamined all compartments.  There  was no residual cartilaginous debris that was loose.  No residual pathology  and minimal surgical intervention.  Next, removed all arthroscopic  instrumentation, and I closed the portals with 4-0 nylon simple suture and  made a curvilinear incision over the lateral joint line.  Subcutaneous  tissue was bluntly dissected.  He had some hypertrophic  lipoma in this area,  and this was removed as well.  We were well anterior to the tibular head, in  line with the iliotibial band into Gerdy tubercle.  At the joint line, there  was a prominence of the joint capsule noted to be cystic in nature.  I made  a small incision there and expressed a fair amount of clear, gelatinous  fluid consistent with a ganglion.  The synovial contents of the cyst were  excised as well.  It was hyperemic.  There was hypertrophic synovium noted  within the cyst as well.  Electrocautery was utilized to achieve hemostasis  and removed as much of the cyst as we felt possible.  We then repaired the  retinaculum with #1 Vicryl interrupted figure-of-eight sutures.  It was felt  we removed the entire cyst.  We electrocauterized the region within the cyst  and then oversewed it with #1 Vicryl interrupted figure-of-eight sutures.   Next, the wound copiously irrigated.  We repaired the subcutaneous tissue  with #0 and 2-0 Vicryl simple sutures.  The skin was reapproximated with 4-0  running nylon.  Compressive dressing was placed.  Steri-Strips applied.  He  was then awoken without difficulty and transported to the recovery room in  satisfactory condition.  The patient tolerated the procedure well with no  known complications.                                               Jene Every, M.D.    Cordelia Pen  D:  10/08/2002  T:  10/08/2002  Job:  [id number]
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
## 2010-09-11 NOTE — H&P (Signed)
NAME:  [name], [name]                           ACCOUNT NO.:  [id number]   MEDICAL RECORD NO.:  [id number]                   PATIENT TYPE:  AMB   LOCATION:  DAY                                  FACILITY:  [facility]   PHYSICIAN:  Javier Docker, M.D.               DATE OF BIRTH:  [birth year]   DATE OF ADMISSION:  DATE OF DISCHARGE:                                HISTORY & PHYSICAL   DATE OF SURGERY:  October 04, 2002.   CHIEF COMPLAINT:  Right knee pain.   HISTORY OF PRESENT ILLNESS:  [name] is a 75 year old gentleman who  initially came here in April complaining of a knot on the outside of his  knee. He stated that it had been there for a few years but was increasing in  size and had become painful, especially with activity. On initial inspection  of the knee, there was a large mass of 5x6 cm along the lateral aspect. It  is noncystic in nature. A MR study was ordered to better differentiate the  makeup of the mass. MRI revealed a medial meniscal tear along with a lobular  mass, which was on MRI, felt to be a ganglion associated with synovitis. An  aspiration was attempted where only a few milliliters of clear straw-colored  fluid was extracted. However, the patient remained symptomatic and has  increasing knee pain, painful over the cyst area. At this time, it was felt  that the patient would benefit from arthroscopic debridement open excision  of the cyst. Risks and benefits of the surgery were discussed with him with  Dr. Shelle Iron and he wishes to proceed.   PAST MEDICAL HISTORY:  Notable for anxiety.   CURRENT MEDICATIONS:  1. Darvocet p.r.n. pain.  2. Flexeril 1 p.o. t.i.d.  3. Naprosyn 500 mg 1 p.o. q.d.  4. Zantac 150 mg 1 p.o. b.i.d.  5. Valium 5 mg 1 p.o. t.i.d.   PAST SURGICAL HISTORY:  Include left rotator cuff repair in 2002 and neck  fusion in 2001.   SOCIAL HISTORY:  The patient is married. He is retired. He denies any  tobacco or alcohol intake.   FAMILY  HISTORY:  Noncontributory.   REVIEW OF SYSTEMS:  GENERAL: The patient denies any fever, chills, night  sweats, or bleeding tendencies. CNS: No blurred or double vision, seizure,  headache, or paralysis. RESPIRATORY: No shortness of breath, productive  cough, or hemoptysis. CARDIOVASCULAR: No chest pain, angina, orthopnea. GU:  No dysuria, hematuria or discharge. GI: No nausea, vomiting, diarrhea,  constipation, or bloody stools. MUSCULOSKELETAL: Pertinent as per HPI.   PHYSICAL EXAMINATION:  VITAL SIGNS: Pulse 80, respiratory rate 16, blood  pressure 140/96.  GENERAL: This is a well developed, well nourished 75 year old gentleman in  no acute distress. The patient does walk with an antalgic gait.  HEENT: Atraumatic. Normocephalic. Pupils are equal, round, and reactive to  light. Extraocular  muscles intact.  NECK: Supple with no lymphadenopathy.  CHEST: Clear to auscultation bilaterally. No rhonchi, wheezes, or rales.  HEART: Regular rate and rhythm. Without murmur, rub, or gallop.  BREAST/GU: Not examined. Not pertinent to HPI.  ABDOMEN: Soft, nontender, nondistended. Bowel sounds in all four.  SKIN: No rashes or lesions noted.  EXTREMITIES: There is approximately a 5x6 cm large mass on the lower aspect  of the joint. There is no retraction of the skin. This mass is nonmobile.  There is no knee effusion. The patient has full range of motion  of the  knee. There is no joint line tenderness.   IMPRESSION:  Medial meniscal tear with associated cyst.   PROCEDURE:  The patient will go to [facility] and undergo right  knee arthroscopy with open excision of the cyst by Dr. Jene Every.     Roma Schanz, P.A.                   Javier Docker, M.D.    CS/MEDQ  D:  10/04/2002  T:  10/04/2002  Job:  [id number]

## 2010-09-11 NOTE — Op Note (Signed)
[facility]  Patient:    [name], [name] Visit Number: [id number] MRN: [id number]          Service Type: SUR Location: 4W [id number] [id number] Attending Physician:  Pierce Crane Dictated by:   Javier Docker, M.D. Proc. Date: 08/21/01 Admit Date:  08/21/2001                             Operative Report  PREOPERATIVE DIAGNOSES: 1. Rotator cuff tear. 2. Impingement syndrome, left shoulder.  POSTOPERATIVE DIAGNOSES: 1. Rotator cuff tear. 2. Impingement syndrome, left shoulder. 3. Hypertrophic bursitis.  PROCEDURE PERFORMED: 1. Left open rotator cuff repair. 2. Subacromial decompression. 3. Bursectomy.  ANESTHESIA: General.  SURGEON:  Javier Docker, M.D.  ASSISTANTJill Side P. Mahar, P.A.-C.  BRIEF HISTORY AND INDICATION:  A 75 year old with refractory shoulder pain secondary to a rotator cuff tear.  Operative intervention was indicated for repair and subacromial decompression.  Risks and benefits discussed including bleeding, infection, damage to vascular structures, suboptimal range of motion, need for manipulation, etc.  TECHNIQUE:  Patient supine in beach chair position.  After induction of adequate general anesthesia following a regional block, 1 g Kefzol, the neck in the neutral position due to history of cervical fusion, the left shoulder and upper extremity and precordial regions were prepped and draped in the usual sterile fashion.  Surgical marker was utilized to delineate the acromion, AC joint, coracoid.  Incision was made over the anterior aspect of the acromion in Langers lines after infiltration with 0.25% Marcaine with epinephrine.  Subcutaneous tissue was dissected.  Electrocautery was utilized to achieve hemostasis.  The raphe between the anterior and lateral heads of the deltoid was developed, divided, subperiosteally elevated from the anterior aspect of the acromion to the anterolateral corner and to the [facility]  joint. Following this, the CA ligament was detached and excised.  Bennett retractor was placed.  The anterolateral spur on the acromion was identified and removed with an oscillating saw and contoured with a Matt Holmes rongeur.  About 2-3 mm of the anterior aspect of the acromion was removed.  Next, inspection of the joint revealed a significant hypertrophic, swollen bursa.  This was excised. A tear was noted in the insertion of the supraspinatus on the greater tuberosity near full-thickness.  It measured approximately 1 cm x 1 cm.  This was incised over the area of the tear.  The edges of the tear were debrided with a 15 blade.  Beneath this, near the greater tuberosity and articular surface, was rongeured with a Baer rongeur to cancellous bleeding bone.  This was then copiously irrigated.  A Mitek suture anchor was then advanced at this point, and the tear was then repaired into this trough of decorticated bone. This repaired the tear satisfactorily.  This was then supplemented with #1 Vicryl interrupted figure-of-eight sutures.  Full rotation of the humerus revealed no tension upon the repair site.  Good active range.  We digitally manipulated any residual adhesive capsulitis posteriorly and laterally.  This was then copiously irrigated.  Raphe between the anterolateral heads was then repaired to the acromion with #1 Vicryl interrupted figure-of-eight sutures. Subcutaneous tissue reapproximated with 2-0 Vicryl simple sutures.  Skin was reapproximated with 4-0 subcuticular Prolene.  The wound reinforced with Steri-Strips.  Sterile dressing was applied.  The patient was then placed in an abduction pillow, extubated without difficulty, and transported to the recovery room in satisfactory condition.  The  patient tolerated the procedure well with no complications.  Blood loss was minimal. Dictated by:   Javier Docker, M.D. Attending Physician:  Pierce Crane DD:  08/21/01 TD:   08/21/01 Job: [id number]
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
## 2010-09-22 ENCOUNTER — Encounter: Payer: Self-pay | Admitting: Cardiology

## 2010-09-23 ENCOUNTER — Ambulatory Visit (INDEPENDENT_AMBULATORY_CARE_PROVIDER_SITE_OTHER): Payer: Medicare Other | Admitting: Cardiology

## 2010-09-23 ENCOUNTER — Encounter: Payer: Self-pay | Admitting: Cardiology

## 2010-09-23 VITALS — BP 122/88 | HR 72 | Ht 75.0 in | Wt 263.1 lb

## 2010-09-23 DIAGNOSIS — I10 Essential (primary) hypertension: Secondary | ICD-10-CM

## 2010-09-23 DIAGNOSIS — E7800 Pure hypercholesterolemia, unspecified: Secondary | ICD-10-CM

## 2010-09-23 DIAGNOSIS — R5381 Other malaise: Secondary | ICD-10-CM

## 2010-09-23 DIAGNOSIS — R9431 Abnormal electrocardiogram [ECG] [EKG]: Secondary | ICD-10-CM

## 2010-09-23 DIAGNOSIS — R5383 Other fatigue: Secondary | ICD-10-CM

## 2010-09-23 NOTE — Assessment & Plan Note (Signed)
Findings are consistent with LVH which is compatible with his known hypertension.

## 2010-09-23 NOTE — Assessment & Plan Note (Signed)
The symptoms are multifactorial. It seems to have started with his bacterial infection this past year. We do need to make sure that this was not an anginal equivalent symptom given his cardiac risk factors. I have recommended a Lexiscan Cardiolite study to evaluate his cardiovascular risk. If this is normal then he can safely resume the an exercise program.

## 2010-09-23 NOTE — Progress Notes (Signed)
[name] Date of Birth: August 18, 1935   History of Present Illness: [name] is seen at the request of Dr. Collins Scotland for evaluation of an abnormal ECG and cardiac risk. He is a 76 year old white male last seen in the past for bradycardia. At that time we reduced his metoprolol dose to 25 mg twice a day. He did have a nuclear stress test in 2008 which was normal. Patient reports that he had a bad bacterial infection this past year. Ever since then he has had more significant fatigue and weakness with exertion. He denies any specific chest pain or shortness of breath. He does have some sweats with exertion. His activity has been limited by knee pain. He wants to join the [facility] and again a swimming program. He does have cardiac risk factors of hypertension and hyperlipidemia.  Current Outpatient Prescriptions on File Prior to Visit  Medication Sig Dispense Refill  . aspirin 81 MG tablet Take 81 mg by mouth daily.        . clotrimazole-betamethasone (LOTRISONE) cream Apply 1 application topically as directed.        . diazepam (VALIUM) 5 MG tablet Take 5 mg by mouth every 6 (six) hours as needed.        . metoprolol (LOPRESSOR) 50 MG tablet Take 50 mg by mouth 2 (two) times daily.        Marland Kitchen omega-3 acid ethyl esters (LOVAZA) 1 G capsule Take 2 g by mouth 2 (two) times daily.        . pravastatin (PRAVACHOL) 40 MG tablet Take 40 mg by mouth daily.        . traMADol (ULTRAM) 50 MG tablet Take 50 mg by mouth every 6 (six) hours as needed.        . ergocalciferol (VITAMIN D2) 50000 UNITS capsule Take 50,000 Units by mouth once a week. ( ON HOLD )      . DISCONTD: FLUoxetine (PROZAC) 20 MG tablet Take 20 mg by mouth daily.          Allergies  Allergen Reactions  . Cymbalta (Duloxetine Hcl)   . Niaspan (Niacin (Antihyperlipidemic))   . Prednisone     Past Medical History  Diagnosis Date  . Hypertension   . Hyperlipidemia   . GERD (gastroesophageal reflux disease)   . Osteoarthritis     Past  Surgical History  Procedure Date  . Nose surgery   . External ear surgery   . Hemorrhoid surgery   . Decubitus ulcer excision   . Cervical fusion   . Rotator cuff repair     History  Smoking status  . Former Smoker -- 1.0 packs/day for 30 years  . Types: Cigarettes  . Quit date: 09/23/1975  Smokeless tobacco  . Never Used    History  Alcohol Use No    Family History  Problem Relation Age of Onset  . Heart attack Brother 70    CABG x5  . Heart attack Brother 74    stent  . Coronary artery disease Brother     Review of Systems: The review of systems is positive for some type of bacterial infection this past year. He has severe osteoarthritis of his knees.  All other systems were reviewed and are negative.  Physical Exam: BP 122/88  Pulse 72  Ht 6\' 3"  (1.905 m)  Wt 263 lb 2 oz (119.353 kg)  BMI 32.89 kg/m2 He is an overweight white male in no acute distress. Normocephalic, atraumatic. Pupils  are round and reactive to light and accommodation. Extraocular movements are full. Oropharynx is clear. Neck is supple no JVD, adenopathy, thyromegaly, or bruits. Lungs are clear. Cardiac exam reveals a regular rate and rhythm without gallop, murmur, or click. Abdomen is soft and nontender. He has no hepatosplenomegaly or masses. Femoral and pedal pulses are 2+ and symmetric. He has no edema. Skin is warm and dry. He is alert and oriented x3. Cranial nerves II through XII are intact. LABORATORY DATA: ECG demonstrates normal sinus rhythm with LVH by voltage. Otherwise no acute change.  Assessment / Plan:

## 2010-09-23 NOTE — Patient Instructions (Addendum)
We will schedule you for a nuclear stress test.  If this is normal then you can safely begin your exercise program.  Lexiscan Stress Test Your caregiver has ordered a Lexiscan Stress Test with nuclear imaging. The purpose of this test is to evaluate the blood supply to your heart muscle. This procedure is referred to as a "Non-Invasive Stress Test." This is because other than having an IV started in your vein, nothing is inserted or "invades" your body. Cardiac stress tests are done to find areas of poor blood flow to the heart by determining the extent of coronary artery disease (CAD). Some patients exercise on a treadmill, which naturally increases the blood flow to your heart. However, almost half of the patients undergoing a treadmill stress test each year are unable to exercise adequately. This is due to various medical conditions. For these patients, a pharmacologic/chemical stress agent called Eugenie Birks is used on patients without a history of asthma. This medicine will mimic walking on a treadmill by temporarily increasing your coronary blood flow. The side effects of this medicine include:  Headache.   Dizziness.   Shortness of breath.   Flushing.   Chest pain/pressure.   Feeling sick to your stomach (nauseous).   Increased heart rate.   Abdominal discomfort.   Low blood pressure.  BEFORE THE PROCEDURE  Avoid all forms of caffeine 24 hours before your test. This includes coffee, tea (even decaffeinated brands), caffeinated sodas, chocolate, cocoa and certain pain medications that contain caffeine.   Do not eat anything 6 hours before the test. In hospitalized patients, this means nothing by mouth after midnight.   Patients with diabetes that are not hospitalized (outpatients) should talk to their caregiver about how much, if any, insulin or oral diabetic medications they should take the day of the test. You should also talk about the type of light snack that you should eat the  morning of the test.   Patients with diabetes that are hospitalized (inpatients) will follow the cardiologist's written instructions that will be given to you.   Outpatients should bring a snack. Use the hospital vending machines so that you may eat right after the stress phase.   Wear comfortable shoes. There is at least an hour wait before the stress phase of nuclear scanning is done. The total procedure time is approximately 3 hours.   The following medications should be stopped 24 hours before your stress test: Beta-blockers and nitroglycerine (patches or paste should be removed 4 hours before the test).   Women, tell your caregiver if there is any possibility that you may be pregnant or if you are currently breastfeeding.  PROCEDURE There are two separate nuclear images taken using a nuclear camera. These images require a small dose of a radiotracer called an isotope. Most diagnostic nuclear medicine procedures result in low radiation exposure. Allergic reactions to the isotope may include slight redness going up the arm and pain during the injection. However, these reactions are extremely rare and usually mild. Eugenie Birks is given very rapidly over 10-15 seconds in the vein (intravenously) followed by a nuclear isotope. The medication causes the arteries in your heart to widen (dilate), and the nuclear isotope lights up those arteries so that the nuclear images are clear. Together, this shows whether the coronary blood flow is normal or abnormal. During this stress phase, you will be connected to an EKG machine. Your blood pressure and oxygen levels will be monitored by the cardiologist and cardiac nurse. This part usually  lasts 5-10 minutes. For inpatients, the procedure is done in the patient's room. For outpatients, the procedure is done in the stress lab.  The "Resting Phase" is done before the Lexiscan injection and shows how your heart functions at rest. The "Stress Phase" is done about 1  hour after the Lexiscan injection and determines how your heart functions under stress.  LEXISCAN STRESS TEST IS USEFUL FOR DETERMINING:  The adequacy of blood flow to your heart during increased levels of activity in order to clear you for discharge home.   The extent of coronary artery blockage caused by CAD.   Your prognosis if you have suffered a heart attack.   The effectiveness of cardiac procedures done, such as an angioplasty, which can increase the circulation in your coronary arteries.   Cause(s) of chest pain/pressure.  RESULTS Not all test results are available during your visit. If your test results are not back during the visit, make an appointment with your caregiver to find out the results. Do not assume everything is normal if you have not heard from your caregiver or the medical facility. It is important for you to follow up on all of your test results. Document Released: 08/29/2008  [facility] Patient Information 2011 [place], [place].

## 2010-09-29 ENCOUNTER — Ambulatory Visit (HOSPITAL_COMMUNITY): Payer: Medicare Other | Attending: Cardiology | Admitting: Radiology

## 2010-09-29 VITALS — Ht 74.0 in | Wt 249.0 lb

## 2010-09-29 DIAGNOSIS — R5383 Other fatigue: Secondary | ICD-10-CM | POA: Insufficient documentation

## 2010-09-29 DIAGNOSIS — R9431 Abnormal electrocardiogram [ECG] [EKG]: Secondary | ICD-10-CM

## 2010-09-29 DIAGNOSIS — R5381 Other malaise: Secondary | ICD-10-CM | POA: Insufficient documentation

## 2010-09-29 MED ORDER — TECHNETIUM TC 99M TETROFOSMIN IV KIT
33.0000 | PACK | Freq: Once | INTRAVENOUS | Status: AC | PRN
  Administered 2010-09-29: 33 via INTRAVENOUS

## 2010-09-29 MED ORDER — TECHNETIUM TC 99M TETROFOSMIN IV KIT
11.0000 | PACK | Freq: Once | INTRAVENOUS | Status: AC | PRN
  Administered 2010-09-29: 11 via INTRAVENOUS

## 2010-09-29 MED ORDER — REGADENOSON 0.4 MG/5ML IV SOLN
0.4000 mg | Freq: Once | INTRAVENOUS | Status: AC
  Administered 2010-09-29: 0.4 mg via INTRAVENOUS

## 2010-09-29 NOTE — Progress Notes (Signed)
[facility] SITE 3 NUCLEAR MED [address] [place] [address] [phone]  Cardiology Nuclear Med Study  [name] is a 75 y.o. male [id number] 10-15-1935   Nuclear Med Background Indication for Stress Test:  Evaluation for Ischemia and Abnormal EKG History:  COPD and '08 ONG:EXBMWU, EF=71% Cardiac Risk Factors: Family History - CAD, History of Smoking, Hypertension, Lipids and Overweight  Symptoms:  Fatigue and Fatigue with Exertion   Nuclear Pre-Procedure Caffeine/Decaff Intake:  None NPO After: 7:00pm   Lungs:  Clear.  O2 sat 98% on RA. IV 0.9% NS with Angio Cath:  20g  IV Site: R Antecubital  IV Started by:  [name], EMT-P  Chest Size (in):  52 Cup Size: n/a  Height: 6\' 2"  (1.88 m)  Weight:  249 lb (112.946 kg)  BMI:  Body mass index is 31.97 kg/(m^2). Tech Comments:  Metoprolol held x 24 hours, per patient.    Nuclear Med Study 1 or 2 day study: 1 day  Stress Test Type:  Treadmill/Lexiscan  Reading XL:KGMWN [name],M.D. Order Authorizing Provider:  [name], MD  Resting Radionuclide: Technetium 77m Tetrofosmin  Resting Radionuclide Dose: 11.0 mCi   Stress Radionuclide:  Technetium 31m Tetrofosmin  Stress Radionuclide Dose: 33.0 mCi           Stress Protocol Rest HR: 55 Stress HR: 91  Rest BP: 113/86 Stress BP: 175/97  Exercise Time (min): 2:00 METS: n/a   Predicted Max HR: 147 bpm % Max HR: 61.9 bpm Rate Pressure Product: 02725   Dose of Adenosine (mg):  n/a Dose of Lexis can: 0.4 mg  Dose of Atropine (mg): n/a Dose of Dobutamine: n/a mcg/kg/min (at max HR)  Stress Test Technologist: [name], CMA-N  Nuclear Technologist:  [name], CNMT     Rest Procedure:  Myocardial perfusion imaging was performed at rest 45 minutes following the intravenous administration of Technetium 34m Tetrofosmin.  Rest ECG: LVH.  Stress Procedure:  The patient received IV Lexiscan 0.4 mg over 15-seconds with concurrent low level  exercise and then Technetium 66m Tetrofosmin was injected at 30-seconds while the patient continued walking one more minute.  There were no significant changes with Lexiscan, isolated PAC.  Quantitative spect images were obtained after a 45-minute delay.  Stress ECG: No significant change from baseline ECG  QPS Raw Data Images:  Normal; no motion artifact; normal heart/lung ratio. Stress Images:  There is decreased uptake in the inferior wall. Rest Images:  There is decreased uptake in the inferior wall. Subtraction (SDS):  No evidence of ischemia. Transient Ischemic Dilatation (Normal <1.22):  1.02 Lung/Heart Ratio (Normal <0.45):  0.21  Quantitative Gated Spect Images QGS EDV:  142 ml QGS ESV:  50 ml QGS cine images:  NL LV Function; NL Wall Motion QGS EF: 65%  Impression Exercise Capacity:  Lexiscan with low level exercise. BP Response:  Normal blood pressure response. Clinical Symptoms:  No chest pain. ECG Impression:  No significant ST segment change suggestive of ischemia. Comparison with Prior Nuclear Study: No images to compare  Overall Impression:  Low risk nuclear study with no ischemia.  Thinning of the inferior wall on shrot axis images not appreciated on vertical.  QPS normal and normal surface images with no wall motion abnormality   [name]

## 2010-09-30 NOTE — Progress Notes (Signed)
Nuclear stress test is low risk. EF is normal. He may safely resume an exercise program.

## 2010-09-30 NOTE — Progress Notes (Signed)
COPY ROUTED TO DR. JORDAN.Falecha L Clark ° ° °

## 2010-10-02 NOTE — Progress Notes (Signed)
Lm w/ results of stress test. Will send copy to Dr. [name].

## 2011-09-11 ENCOUNTER — Encounter: Payer: Self-pay | Admitting: Cardiology

## 2012-05-22 ENCOUNTER — Other Ambulatory Visit: Payer: Self-pay | Admitting: Neurosurgery

## 2012-05-22 DIAGNOSIS — M549 Dorsalgia, unspecified: Secondary | ICD-10-CM

## 2012-05-22 DIAGNOSIS — M5410 Radiculopathy, site unspecified: Secondary | ICD-10-CM

## 2012-05-26 ENCOUNTER — Ambulatory Visit
Admission: RE | Admit: 2012-05-26 | Discharge: 2012-05-26 | Disposition: A | Discharge status: Home / Self Care | Payer: Medicare Other | Source: Ambulatory Visit | Attending: Neurosurgery | Admitting: Neurosurgery

## 2012-05-26 VITALS — BP 135/93 | HR 100 | Ht 74.0 in | Wt 260.0 lb

## 2012-05-26 DIAGNOSIS — M5410 Radiculopathy, site unspecified: Secondary | ICD-10-CM

## 2012-05-26 DIAGNOSIS — M549 Dorsalgia, unspecified: Secondary | ICD-10-CM

## 2012-05-26 MED ORDER — IOHEXOL 180 MG/ML  SOLN
20.0000 mL | Freq: Once | INTRAMUSCULAR | Status: AC | PRN
  Administered 2012-05-26: 20 mL via INTRATHECAL

## 2012-05-26 MED ORDER — DIAZEPAM 5 MG PO TABS
5.0000 mg | ORAL_TABLET | Freq: Once | ORAL | Status: AC
  Administered 2012-05-26: 5 mg via ORAL

## 2012-05-26 MED ORDER — ONDANSETRON HCL 4 MG/2ML IJ SOLN: 4.0000 mg | Freq: Four times a day (QID) | INTRAMUSCULAR | Status: DC | PRN

## 2012-05-26 NOTE — Progress Notes (Signed)
Pt states has been off tramadol for the past 2 days.

## 2013-08-06 ENCOUNTER — Other Ambulatory Visit: Payer: Self-pay | Admitting: Neurosurgery

## 2013-08-06 DIAGNOSIS — M4310 Spondylolisthesis, site unspecified: Secondary | ICD-10-CM

## 2013-08-08 ENCOUNTER — Ambulatory Visit
Admission: RE | Admit: 2013-08-08 | Discharge: 2013-08-08 | Disposition: A | Discharge status: Home / Self Care | Payer: Medicare Other | Source: Ambulatory Visit | Attending: Neurosurgery | Admitting: Neurosurgery

## 2013-08-08 DIAGNOSIS — M4310 Spondylolisthesis, site unspecified: Secondary | ICD-10-CM

## 2013-08-08 MED ORDER — IOHEXOL 180 MG/ML  SOLN
1.0000 mL | Freq: Once | INTRAMUSCULAR | Status: AC | PRN
  Administered 2013-08-08: 1 mL via EPIDURAL

## 2013-08-08 MED ORDER — METHYLPREDNISOLONE ACETATE 40 MG/ML INJ SUSP (RADIOLOG
120.0000 mg | Freq: Once | INTRAMUSCULAR | Status: AC
  Administered 2013-08-08: 120 mg via EPIDURAL

## 2013-08-08 NOTE — Discharge Instructions (Signed)

## 2014-11-07 ENCOUNTER — Encounter: Payer: Self-pay | Admitting: Internal Medicine

## 2014-12-26 ENCOUNTER — Encounter: Payer: Self-pay | Admitting: Gastroenterology

## 2015-02-17 ENCOUNTER — Other Ambulatory Visit: Payer: Self-pay | Admitting: Neurosurgery

## 2015-02-17 DIAGNOSIS — M545 Low back pain: Principal | ICD-10-CM

## 2015-02-17 DIAGNOSIS — G8929 Other chronic pain: Secondary | ICD-10-CM

## 2015-02-26 ENCOUNTER — Ambulatory Visit
Admission: RE | Admit: 2015-02-26 | Discharge: 2015-02-26 | Disposition: A | Discharge status: Home / Self Care | Payer: Medicare Other | Source: Ambulatory Visit | Attending: Neurosurgery | Admitting: Neurosurgery

## 2015-02-26 DIAGNOSIS — G8929 Other chronic pain: Secondary | ICD-10-CM

## 2015-02-26 DIAGNOSIS — M545 Low back pain, unspecified: Secondary | ICD-10-CM

## 2015-02-26 MED ORDER — METHYLPREDNISOLONE ACETATE 40 MG/ML INJ SUSP (RADIOLOG
120.0000 mg | Freq: Once | INTRAMUSCULAR | Status: AC
  Administered 2015-02-26: 120 mg via EPIDURAL

## 2015-02-26 MED ORDER — IOHEXOL 180 MG/ML  SOLN
1.0000 mL | Freq: Once | INTRAMUSCULAR | Status: DC | PRN
Start: 2015-02-26 — End: 2015-02-27
  Administered 2015-02-26: 1 mL via EPIDURAL

## 2015-02-26 NOTE — Discharge Instructions (Signed)

## 2015-06-06 ENCOUNTER — Other Ambulatory Visit: Payer: Self-pay | Admitting: Neurosurgery

## 2015-06-06 DIAGNOSIS — M549 Dorsalgia, unspecified: Secondary | ICD-10-CM

## 2015-06-14 ENCOUNTER — Ambulatory Visit
Admission: RE | Admit: 2015-06-14 | Discharge: 2015-06-14 | Disposition: A | Discharge status: Home / Self Care | Payer: Medicare Other | Source: Ambulatory Visit | Attending: Neurosurgery | Admitting: Neurosurgery

## 2015-06-14 DIAGNOSIS — M549 Dorsalgia, unspecified: Secondary | ICD-10-CM

## 2015-06-24 ENCOUNTER — Other Ambulatory Visit: Payer: Self-pay | Admitting: Neurosurgery

## 2015-06-24 DIAGNOSIS — M4316 Spondylolisthesis, lumbar region: Secondary | ICD-10-CM

## 2015-06-25 ENCOUNTER — Other Ambulatory Visit: Payer: Self-pay | Admitting: Neurosurgery

## 2015-06-25 ENCOUNTER — Ambulatory Visit
Admission: RE | Admit: 2015-06-25 | Discharge: 2015-06-25 | Disposition: A | Discharge status: Home / Self Care | Payer: Medicare Other | Source: Ambulatory Visit | Attending: Neurosurgery | Admitting: Neurosurgery

## 2015-06-25 DIAGNOSIS — M4316 Spondylolisthesis, lumbar region: Secondary | ICD-10-CM

## 2015-06-25 MED ORDER — METHYLPREDNISOLONE ACETATE 40 MG/ML INJ SUSP (RADIOLOG
120.0000 mg | Freq: Once | INTRAMUSCULAR | Status: AC
  Administered 2015-06-25: 120 mg via EPIDURAL

## 2015-06-25 MED ORDER — IOHEXOL 180 MG/ML  SOLN
1.0000 mL | Freq: Once | INTRAMUSCULAR | Status: AC | PRN
Start: 2015-06-25 — End: 2015-06-25
  Administered 2015-06-25: 1 mL via EPIDURAL

## 2015-06-25 NOTE — Discharge Instructions (Addendum)

## 2015-09-24 DIAGNOSIS — G629 Polyneuropathy, unspecified: Secondary | ICD-10-CM | POA: Insufficient documentation

## 2015-12-15 ENCOUNTER — Ambulatory Visit (INDEPENDENT_AMBULATORY_CARE_PROVIDER_SITE_OTHER): Payer: Medicare Other | Admitting: Neurology

## 2015-12-15 ENCOUNTER — Encounter: Payer: Self-pay | Admitting: Neurology

## 2015-12-15 VITALS — BP 109/78 | HR 65 | Ht 75.0 in | Wt 253.2 lb

## 2015-12-15 DIAGNOSIS — I10 Essential (primary) hypertension: Secondary | ICD-10-CM | POA: Diagnosis not present

## 2015-12-15 DIAGNOSIS — H8113 Benign paroxysmal vertigo, bilateral: Secondary | ICD-10-CM | POA: Diagnosis not present

## 2015-12-15 DIAGNOSIS — H8110 Benign paroxysmal vertigo, unspecified ear: Secondary | ICD-10-CM | POA: Insufficient documentation

## 2015-12-15 NOTE — Patient Instructions (Signed)
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 If it recurs, do it again until symptoms free for 2 weeks.  - follow up with PCP regularly - check BP at home  - follow up in 2 months.

## 2015-12-15 NOTE — Progress Notes (Signed)
NEUROLOGY CLINIC NEW PATIENT NOTE  NAME: [name] DOB: [birth year] REFERRING PHYSICIAN: Delorse Lek, MD  I saw [name] as a new consult in the neurovascular clinic today regarding  Chief Complaint  Patient presents with  . Referral    Referral from Dr.Brent Doristine Counter MD for dizzy spells.  Marland Kitchen  HPI: [name] is a 80 y.o. male with PMH of HTN and CAD who presents as a new patient for Vertigo.  Patient stated that one day in May this year, he got out of bed in the morning and felt room spinning sensation, he laid back down, waited about 5-10 minutes, he again got out of bed which he felt okay. However, at night when he laid back down for sleep, he had a vertigo again lasted about several minutes. Since then, he had this episode almost every day getting up from bed in the morning and laying back down at night. As time goes by, his vertigo seems not as bad as in the beginning. He also complains that in bed at night, turning from left to right or right to left can sometimes causing vertigo too. He denies any dizziness spells during the day, with walking, driving, or standing up from chairs. He denies any headache, fall, LOC, weakness or numbness. He has been followed with PCP, was put on meclizine and Valium, which did not show much effect.  He has history of HTN, on Toprol. Today blood pressure 109/78. He was a history of CAD, currently on aspirin and pravastatin.  Denies any smoking, alcohol drinking or illicit drugs.  Past Medical History:  Diagnosis Date  . Chronic pain   . GAD (generalized anxiety disorder)   . GERD (gastroesophageal reflux disease)   . Hyperlipidemia   . Hypertension   . Low back pain radiating to left lower extremity   . Low back pain radiating to right lower extremity   . Osteoarthritis   . Peripheral polyneuropathy [facility])    Past Surgical History:  Procedure Laterality Date  . CERVICAL FUSION    . DECUBITUS ULCER EXCISION    . EXTERNAL EAR  SURGERY    . HEMORRHOID SURGERY    . NOSE SURGERY    . ROTATOR CUFF REPAIR     Family History  Problem Relation Age of Onset  . Heart attack Brother 70    CABG x5  . Coronary artery disease Brother   . Heart attack Brother 74    stent   Current Outpatient Prescriptions  Medication Sig Dispense Refill  . aspirin 81 MG tablet Take 81 mg by mouth daily.      . clotrimazole-betamethasone (LOTRISONE) cream Apply 1 application topically as directed.      . diazepam (VALIUM) 5 MG tablet Take 5 mg by mouth every 6 (six) hours as needed.      . ergocalciferol (VITAMIN D2) 50000 UNITS capsule Take 50,000 Units by mouth once a week. ( ON HOLD )    . gabapentin (NEURONTIN) 300 MG capsule Take 300 mg by mouth 2 (two) times daily.    . metoprolol (LOPRESSOR) 50 MG tablet Take 50 mg by mouth 2 (two) times daily.      Marland Kitchen omega-3 acid ethyl esters (LOVAZA) 1 G capsule Take 2 g by mouth 2 (two) times daily.      . pravastatin (PRAVACHOL) 40 MG tablet Take 40 mg by mouth daily.      . traMADol (ULTRAM) 50 MG tablet Take 50  mg by mouth every 6 (six) hours as needed.       No current facility-administered medications for this visit.    Allergies  Allergen Reactions  . Prednisone Other (See Comments)    Severe restlessness  . Niaspan [Niacin Er]     Pt does not know if this is an allergy  . Cymbalta [Duloxetine Hcl] Other (See Comments)    constipation   Social History   Social History  . Marital status: Married    Spouse name: N/A  . Number of children: 1  . Years of education: N/A   Occupational History  . 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 Smoking status: Former Smoker    Packs/day: 1.00    Years: 30.00    Types: Cigarettes    Quit date: 09/23/1975  . Smokeless tobacco: Never Used  . Alcohol use No  . Drug use: No  . Sexual activity: Not on file   Other Topics Concern  . Not on file   Social History Narrative  . No narrative on file     Review of Systems Full 14 system review of systems performed and notable only for those listed, all others are neg:  Constitutional:   Cardiovascular:  Ear/Nose/Throat:   Skin:  Eyes:   Respiratory:   Gastroitestinal:   Genitourinary:  Hematology/Lymphatic:   Endocrine:  Musculoskeletal:  Joint pain Allergy/Immunology:   Neurological:  Dizziness Psychiatric: Anxiety Sleep:    Physical Exam  Vitals:   12/15/15 1531  BP: 109/78  Pulse: 65    General - Well nourished, well developed, in no apparent distress.  Ophthalmologic - Sharp disc margins OU.  Cardiovascular - Regular rate and rhythm.   Neck - supple, no nuchal rigidity .  Mental Status -  Level of arousal and orientation to time, place, and person were intact. Language including expression, naming, repetition, comprehension, reading, and writing was assessed and found intact. Fund of Knowledge was assessed and was intact.  Cranial Nerves II - XII - II - Visual field intact OU. III, IV, VI - Extraocular movements intact. V - Facial sensation intact bilaterally. VII - Facial movement intact bilaterally. VIII - Hearing & vestibular intact bilaterally. X - Palate elevates symmetrically. XI - Chin turning & shoulder shrug intact bilaterally. XII - Tongue protrusion intact.  Motor Strength - The patient's strength was normal in all extremities and pronator drift was absent.  Bulk was normal and fasciculations were absent.   Motor Tone - Muscle tone was assessed at the neck and appendages and was normal.  Reflexes - The patient's reflexes were normal in all extremities and he had no pathological reflexes.  Sensory - Light touch, temperature/pinprick, vibration and proprioception, and Romberg testing were assessed and were normal.    Coordination - The patient had normal movements in the hands and feet with no ataxia or dysmetria.  Tremor was absent.  Gait and Station - broad-based gait  Dix-Hallpike  testing negative except right side getting up from bed feeling dizziness, no nystagmus seen.   Imaging None  Lab Review None    Assessment and Plan:   In summary, [name] is a 80 y.o. male with PMH of HTN and CAD presents episodic vertigo associated with positional changes. Patient history consistent with BPPV. Examined this time has no significant finding including Dix-Hallpike testing. We will refer to PT for maneuver training. Patient is encouraged to getting up  slow to avoid any falls.   - physical therapy for maneuver training to treat for BPPV - please do the maneuver 5-10 times a day and continue until 2 weeks after no more symptoms. If it recurs, do it again until symptoms free for 2 weeks.  - follow up with PCP regularly - check BP at home  - follow up in 2 months.   Thank you very much for the opportunity to participate in the care of this patient.  Please do not hesitate to call if any questions or concerns arise.  Orders Placed This Encounter  Procedures  . Ambulatory referral to Physical Therapy    Referral Priority:   Urgent    Referral Type:   Physical Medicine    Referral Reason:   Specialty Services Required    Requested Specialty:   Physical Therapy    Number of Visits Requested:   1    Meds ordered this encounter  Medications  . gabapentin (NEURONTIN) 300 MG capsule    Sig: Take 300 mg by mouth 2 (two) times daily.    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 If it recurs, do it again until symptoms free for 2 weeks.  - follow up with PCP regularly - check BP at home  - follow up in 2 months.    Marvel PlanJindong Shaman Muscarella, MD PhD [facility] [address], [address] [place], [place] [id number] [phone]

## 2015-12-31 ENCOUNTER — Ambulatory Visit: Payer: Medicare Other | Attending: Neurology | Admitting: Rehabilitative and Restorative Service Providers"

## 2015-12-31 DIAGNOSIS — R2689 Other abnormalities of gait and mobility: Secondary | ICD-10-CM | POA: Diagnosis present

## 2015-12-31 DIAGNOSIS — R2681 Unsteadiness on feet: Secondary | ICD-10-CM | POA: Diagnosis present

## 2015-12-31 DIAGNOSIS — R42 Dizziness and giddiness: Secondary | ICD-10-CM

## 2015-12-31 NOTE — Therapy (Signed)
[facility] [address] [place], [place], [address] Phone: [phone]   Fax:  [phone]  Physical Therapy Evaluation  Patient Details  Name: Joseph SamCarl M Hampe MRN: [id number] Date of Birth: November 03, 1935 Referring Provider: Marvel PlanJindong Xu, MD  Encounter Date: 12/31/2015      PT End of Session - 12/31/15 1417    Visit Number 1   Number of Visits 12   Date for PT Re-Evaluation 02/29/16   Authorization Type G code every 10th visit   PT Start Time 1015   PT Stop Time 1105   PT Time Calculation (min) 50 min   Equipment Utilized During Treatment Gait belt   Activity Tolerance Patient tolerated treatment well   Behavior [facility] for tasks assessed/performed      Past Medical History:  Diagnosis Date  . Chronic pain   . GAD (generalized anxiety disorder)   . GERD (gastroesophageal reflux disease)   . Hyperlipidemia   . Hypertension   . Low back pain radiating to left lower extremity   . Low back pain radiating to right lower extremity   . Osteoarthritis   . Peripheral polyneuropathy [facility](HCC)     Past Surgical History:  Procedure Laterality Date  . CERVICAL FUSION    . DECUBITUS ULCER EXCISION    . EXTERNAL EAR SURGERY    . HEMORRHOID SURGERY    . NOSE SURGERY    . ROTATOR CUFF REPAIR      There were no vitals filed for this visit.       Subjective Assessment - 12/31/15 1022    Subjective The patient reports sudden onset of vertigo in May 2016 when getting up out of bed.  He described a sensation of room spinning sensation x minutes.  He still notes a sensation of "whooziness" with sitting up from supine or standing up from sitting.    Patient reports neuropathy began at the same time as his vertigo.     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  Polyneuropathy (gets pain at night that hinders sleep).  The patient is primary caregiver for his wife.    Patient Stated Goals  Anything I can do to get my head and feet working.   Currently in Pain? Yes   Pain Score --  minimal at rest, worse at night   Pain Location Foot   Pain Orientation Right;Left   Pain Descriptors / Indicators Burning   Pain Type Chronic pain   Pain Onset More than a month ago   Aggravating Factors  nighttime   Pain Relieving Factors daytime            [facility] PT Assessment - 12/31/15 1028      Assessment   Medical Diagnosis BPPV   Referring Provider Marvel PlanJindong Xu, MD   Onset Date/Surgical Date --  08/2014   Prior Therapy none     Precautions   Precautions Fall     Restrictions   Weight Bearing Restrictions No     Balance Screen   Has the patient fallen in the past 6 months No   Has the patient had a decrease in activity level because of a fear of falling?  No   Is the patient reluctant to leave their home because of a fear of falling?  No     Home Environment   Living Environment Private residence   Living Arrangements Spouse/significant other   Type of Home House   Home  Access Stairs to enter   Entrance Stairs-Number of Steps 2   Entrance Stairs-Rails Left   Home Layout One level   Home Equipment None     Prior Function   Level of Independence Independent   Leisure Patient is caregiver for wife and does all yard work and house work + helps son with his yard work.     Sensation   Light Touch Appears Intact   Additional Comments burning sensation from neuropathy     Coordination   Finger Nose Finger Test equal and symmetric bilaterally for RAM and FTN.     Posture/Postural Control   Posture/Postural Control Postural limitations   Postural Limitations Rounded Shoulders     Ambulation/Gait   Ambulation/Gait Yes   Ambulation/Gait Assistance 6: Modified independent (Device/Increase time)   Ambulation Distance (Feet) 150 Feet   Assistive device None   Gait Pattern Poor foot clearance - right;Poor foot clearance - left;Wide base of support;Decreased stride length    Ambulation Surface Level   Gait velocity TBA   Gait Comments With horizontal head turns with gait, the patient has lateral veering >12" from midline and has minimal veering with vertical head motion.     Standardized Balance Assessment   Standardized Balance Assessment Berg Balance Test     Berg Balance Test   Sit to Stand Able to stand without using hands and stabilize independently   Standing Unsupported Able to stand safely 2 minutes   Sitting with Back Unsupported but Feet Supported on Floor or Stool Able to sit safely and securely 2 minutes   Stand to Sit Sits safely with minimal use of hands   Transfers Able to transfer safely, minor use of hands   Standing Unsupported with Eyes Closed Able to stand 10 seconds with supervision   Standing Ubsupported with Feet Together Needs help to attain position but able to stand for 30 seconds with feet together   From Standing, Reach Forward with Outstretched Arm Can reach forward >12 cm safely (5")   From Standing Position, Pick up Object from Floor Able to pick up shoe, needs supervision   From Standing Position, Turn to Look Behind Over each Shoulder Needs supervision when turning   Turn 360 Degrees Needs close supervision or verbal cueing   Standing Unsupported, Alternately Place Feet on Step/Stool Able to complete >2 steps/needs minimal assist   Standing Unsupported, One Foot in Front Able to take small step independently and hold 30 seconds   Standing on One Leg Unable to try or needs assist to prevent fall   Total Score 35   Berg comment: 35/56            Vestibular Assessment - 12/31/15 1031      Vestibular Assessment   General Observation The patient veers to the right upon rising in the lobby.  He has decreased foot clearance during walking into clinic.  The patient reports h/o R ear deaf his entire life with a surgery performed to correct hearing without improvement.     Symptom Behavior   Type of Dizziness Spinning    Frequency of Dizziness daily   Duration of Dizziness minutes   Aggravating Factors --  rising from sitting, rolling in bed   Relieving Factors Head stationary     Occulomotor Exam   Occulomotor Alignment Abnormal  Mild L hypertropia   Spontaneous Absent   Gaze-induced Absent   Smooth Pursuits Comment   Saccades Intact   Comment Double vision noted in L visual  field, R eye exhibits an ocular tilt appearance, he has nystagmus noted in upper left quadrant with gaze.       Vestibulo-Occular Reflex   VOR 1 Head Only (x 1 viewing) slow gaze x 1 x 10 reps provokes L posterior head discomfort that settles once he stops moving.   Comment neck ROM observed, neck stiffness, PT stays within small range for HiT.  Head impulse= positive to both sides for refixation saccade worse to the right.     Positional Testing   Sidelying Test Sidelying Right;Sidelying Left   Horizontal Canal Testing Horizontal Canal Right;Horizontal Canal Left     Sidelying Right   Sidelying Right Duration 10 seconds   Sidelying Right Symptoms Right nystagmus  mild R beating nystagmus noted     Sidelying Left   Sidelying Left Duration none   Sidelying Left Symptoms No nystagmus     Horizontal Canal Right   Horizontal Canal Right Duration none   Horizontal Canal Right Symptoms Normal     Horizontal Canal Left   Horizontal Canal Left Duration patient has mild geotropic nystagmus originally noted in L horizontal roll test; with gaze 30 degrees to R, he has apogeotropic nystagmus and with gaze 30 degrees to L he has geotrpic nystagmus noted   Horizontal Canal Left Symptoms Geotrophic;Ageotrophic;Nystagmus                Vestibular Treatment/Exercise - 12/31/15 1441      Vestibular Treatment/Exercise   Vestibular Treatment Provided Habituation;Gaze   Habituation Exercises Francee Piccolo Daroff;Horizontal Roll   Gaze Exercises X1 Viewing Horizontal     Austin Miles   Number of Reps  3   Symptom Description   Patient reports that symptoms reduce with repetition.     Horizontal Roll   Number of Reps  3   Symptom Description  Patient reports that symptoms reduce with repetition     X1 Viewing Horizontal   Foot Position standing near support surface   Comments performed x 30 seconds working on maintaining gaze fixation with cues to slow down to observable refixation saccade.               PT Education - 12/31/15 1417    Education provided Yes   Education Details HEP: habituation (rolling, sit>sidelying), gaze x 1 standing with UE support   Person(s) Educated Patient   Methods Explanation;Demonstration;Handout   Comprehension Verbalized understanding;Returned demonstration          PT Short Term Goals - 12/31/15 1433      PT SHORT TERM GOAL #1   Title The patient will be indep with HEP for motion sensitivity, gaze, balance, and general mobility.   Baseline Target date 01/29/2016   Time 4   Period Weeks     PT SHORT TERM GOAL #2   Title The patient will improve Berg from 35/56 to > or equal to 40/56 to demo dec'ing risk for falls.   Baseline Target date 01/29/2016   Time 4   Period Weeks     PT SHORT TERM GOAL #3   Title The patient will be further assessed on gait speed and goal to follow, as indicated.   Baseline Target date 01/29/2016   Time 4   Period Weeks     PT SHORT TERM GOAL #4   Title The patient will tolerate rolling supine<>bilateral sidelying with no dizziness to demo improved tolerance to bed mobility.   Baseline Target date 01/29/2016   Time 4   Period  Weeks     PT SHORT TERM GOAL #5   Title The patient will tolerate gaze x 1 viewing x 30 seconds at self selected pace without reports of visual blurring or dizziness.   Baseline Target date 01/29/2016   Time 4   Period Weeks           PT Long Term Goals - 12/31/15 1436      PT LONG TERM GOAL #1   Title The patient will verbalize understanding of fall prevention for safety in home/community.    Baseline Target date 11.08/2015   Time 8   Period Weeks     PT LONG TERM GOAL #2   Title The patient will improve Berg from 35/56 to > or equal to 43/56 to demo dec'ing risk for falls.   Baseline Target date 02/29/2016   Time 8   Period Weeks     PT LONG TERM GOAL #3   Title The patient will improve gait speed by 0.5 ft/sec to demo improved functional mobility.   Baseline Target date 02/29/2016   Time 8   Period Weeks     PT LONG TERM GOAL #4   Title The patient will move sit>stand without c/o subjective dizziness.   Baseline Target date 02/29/2016   Time 8   Period Weeks     PT LONG TERM GOAL #5   Title The patient will perform gait with horizontal head turns x 40 feet without loss of balance.   Baseline Target date 02/29/2016   Time 8   Period Weeks               Plan - 12/31/15 1442    Clinical Impression Statement The patient is a 80 year old male presenting with >1 year h/o vertigo, neuropathy, imbalance, and visual changes.  During today's clinical exam, he presented with abnormal oculomotor exam (smooth pursuits with double vision reported in L upper quadrant, nystagmus noted in L upper quadrant).  The patient reports double vision began around same time as onset of vertigo.  He has abnormal VOR per positive head impulse test, dizziness with slow gaze fixation and visual blurring with head motion.  These deficits appear bilateral in nature.  He has abnormal positional testing with a R beating nystagmus (no rotary component) noted with R sidelying test and direction changing nystagmus viewed when in L horizontal roll test.  These symptoms did fatigue with repetition following a peripheral pattern, however the nystagmus did not follow typical peripheral/positional nystagmus.  The patient has motion sensitivity with sit>stand, rolling, sit>sidelying and standing turns.  The patient also has gait/balance deficits with 35/56 on Berg indicating high fall risk.   The patient does  have a h/o R inner ear issues with diminished R hearing since birth and h/o surgery R ear--this may be contributing to multi-faceted impairments noted today.   PT recommended eye exam due to onset of double vision (in L upper quadrant).     Rehab Potential Good   Clinical Impairments Affecting Rehab Potential Multi-factoral nature of deficits   PT Frequency 2x / week   PT Duration 8 weeks  reducing to 1x/week after first 4 weeks   PT Treatment/Interventions ADLs/Self Care Home Management;Canalith Repostioning;Stair training;Gait training;Therapeutic activities;Therapeutic exercise;Balance training;Neuromuscular re-education;Patient/family education;Functional mobility training;Vestibular   PT Next Visit Plan Check gaze HEP, habituation HEP.  MEASURE GAIT SPEED, Balance HEP (feet together, feet together + eyes closed, head motion), dynamic gait/balance activities   Consulted and Agree with Plan of  Care Patient      Patient will benefit from skilled therapeutic intervention in order to improve the following deficits and impairments:  Abnormal gait, Difficulty walking, Dizziness, Decreased activity tolerance, Decreased balance, Decreased mobility  Visit Diagnosis: Dizziness and giddiness  Unsteadiness on feet  Other abnormalities of gait and mobility     Problem List Patient Active Problem List   Diagnosis Date Noted  . BPPV (benign paroxysmal positional vertigo) 12/15/2015  . Fatigue 09/23/2010  . Abnormal ECG 09/23/2010  . Hypertension 09/23/2010  . Hypercholesterolemia 09/23/2010  . ANXIETY 12/01/2009  . GASTRITIS 12/01/2009  . ABDOMINAL WALL PAIN 12/01/2009  . GERD 10/31/2009  . DIVERTICULOSIS-COLON 10/31/2009  . DEGENERATIVE JOINT DISEASE 10/31/2009  . DIARRHEA 10/31/2009    Tilmon Wisehart, PT 12/31/2015, 9:29 PM  [place] [facility] [address] [place], [place], [address] Phone: [phone]   Fax:   [phone]  Name: JENNINGS CORADO MRN: [id number] Date of Birth: Sep 10, 1935

## 2015-12-31 NOTE — Patient Instructions (Signed)
Tip Card 1.The goal of habituation training is to assist in decreasing symptoms of vertigo, dizziness, or nausea provoked by specific head and body motions. 2.These exercises may initially increase symptoms; however, be persistent and work through symptoms. With repetition and time, the exercises will assist in reducing or eliminating symptoms. 3.Exercises should be stopped and discussed with the therapist if you experience any of the following: - Sudden change or fluctuation in hearing - New onset of ringing in the ears, or increase in current intensity - Any fluid discharge from the ear - Severe pain in neck or back - Extreme nausea  Copyright  VHI. All rights reserved.  Rolling   With pillow under head, start on back. Roll to your right side.  Hold until dizziness stops, plus 20 seconds and then roll to the left side.  Hold until dizziness stops, plus 20 seconds.  Repeat sequence 5 times per session. Do 2 sessions per day.  Copyright  VHI. All rights reserved.  Sit to Side-Lying   Sit on edge of bed. Lie down onto the right side and hold until dizziness stops, plus 20 seconds.  Return to sitting and wait until dizziness stops, plus 20 seconds.  Repeat to the left side. Repeat sequence 5 times per session. Do 2 sessions per day.  Copyright  VHI. All rights reserved.  Gaze Stabilization: Tip Card 1.Target must remain in focus, not blurry, and appear stationary while head is in motion. 2.Perform exercises with small head movements (45 to either side of midline). 3.Increase speed of head motion so long as target is in focus. 4.If you wear eyeglasses, be sure you can see target through lens (therapist will give specific instructions for bifocal / progressive lenses). 5.These exercises may provoke dizziness or nausea. Work through these symptoms. If too dizzy, slow head movement slightly. Rest between each exercise. 6.Exercises demand concentration; avoid distractions. 7.For safety,  perform standing exercises close to a counter, wall, corner, or next to someone.  Copyright  VHI. All rights reserved.  Gaze Stabilization: Standing Feet Apart   Stand near countertop or chair for support. Feet shoulder width apart, keeping eyes on target on wall 3 feet away, tilt head down slightly and move head side to side for 30 seconds. Repeat while moving head up and down for 30 seconds. Do 2 sessions per day.   Copyright  VHI. All rights reserved.    Schedule visit with your eye doctor due to double vision in left visual field x 1 year.

## 2016-02-18 ENCOUNTER — Other Ambulatory Visit: Payer: Self-pay | Admitting: Neurosurgery

## 2016-02-18 DIAGNOSIS — M4316 Spondylolisthesis, lumbar region: Secondary | ICD-10-CM

## 2016-02-23 ENCOUNTER — Ambulatory Visit: Payer: Medicare Other | Admitting: Neurology

## 2016-02-26 ENCOUNTER — Other Ambulatory Visit: Payer: Self-pay | Admitting: Neurosurgery

## 2016-02-26 ENCOUNTER — Ambulatory Visit
Admission: RE | Admit: 2016-02-26 | Discharge: 2016-02-26 | Disposition: A | Discharge status: Home / Self Care | Payer: Medicare Other | Source: Ambulatory Visit | Attending: Neurosurgery | Admitting: Neurosurgery

## 2016-02-26 DIAGNOSIS — M4316 Spondylolisthesis, lumbar region: Secondary | ICD-10-CM

## 2016-02-26 MED ORDER — IOPAMIDOL (ISOVUE-M 200) INJECTION 41%
1.0000 mL | Freq: Once | INTRAMUSCULAR | Status: AC
  Administered 2016-02-26: 1 mL via EPIDURAL

## 2016-02-26 MED ORDER — METHYLPREDNISOLONE ACETATE 40 MG/ML INJ SUSP (RADIOLOG
120.0000 mg | Freq: Once | INTRAMUSCULAR | Status: AC
  Administered 2016-02-26: 120 mg via EPIDURAL

## 2016-02-26 NOTE — Discharge Instructions (Signed)

## 2016-06-28 ENCOUNTER — Encounter: Payer: Self-pay | Admitting: Rehabilitative and Restorative Service Providers"

## 2016-06-28 NOTE — Therapy (Signed)
[place] [address] [place], [place], [address] Phone: [phone]   Fax:  [phone]  Patient Details  Name: [name] MRN: [id number] Date of Birth: Jun 13, 1935 Referring Provider:  No ref. provider found  Encounter Date: last encounter 12/31/2015  PHYSICAL THERAPY DISCHARGE SUMMARY  Visits from Start of Care: evaluation only  Current functional level related to goals / functional outcomes: See eval notes- patient called to cancel due to other appts.  Did not return to therapy.   Remaining deficits: See evaluation for patient status.   Education / Equipment: n/a  Plan: Patient agrees to discharge.  Patient goals were not met. Patient is being discharged due to not returning since the last visit.  ?????         Thank you for the referral of this patient. Rudell Cobb, MPT   Verlena Marlette 06/28/2016, 10:51 AM  [facility] [address] [place], [place], [address] Phone: [phone]   Fax:  [phone]

## 2016-08-24 ENCOUNTER — Emergency Department (HOSPITAL_COMMUNITY): Payer: Medicare Other

## 2016-08-24 ENCOUNTER — Encounter (HOSPITAL_COMMUNITY): Payer: Self-pay | Admitting: Emergency Medicine

## 2016-08-24 ENCOUNTER — Emergency Department (HOSPITAL_COMMUNITY)
Admission: EM | Admit: 2016-08-24 | Discharge: 2016-08-24 | Disposition: A | Discharge status: Home / Self Care | Payer: Medicare Other | Attending: Emergency Medicine | Admitting: Emergency Medicine

## 2016-08-24 DIAGNOSIS — I10 Essential (primary) hypertension: Secondary | ICD-10-CM | POA: Diagnosis not present

## 2016-08-24 DIAGNOSIS — E876 Hypokalemia: Secondary | ICD-10-CM | POA: Insufficient documentation

## 2016-08-24 DIAGNOSIS — Z7982 Long term (current) use of aspirin: Secondary | ICD-10-CM | POA: Diagnosis not present

## 2016-08-24 DIAGNOSIS — J00 Acute nasopharyngitis [common cold]: Secondary | ICD-10-CM | POA: Insufficient documentation

## 2016-08-24 DIAGNOSIS — R05 Cough: Secondary | ICD-10-CM | POA: Insufficient documentation

## 2016-08-24 DIAGNOSIS — R059 Cough, unspecified: Secondary | ICD-10-CM

## 2016-08-24 DIAGNOSIS — R0689 Other abnormalities of breathing: Secondary | ICD-10-CM | POA: Diagnosis not present

## 2016-08-24 DIAGNOSIS — Z79899 Other long term (current) drug therapy: Secondary | ICD-10-CM | POA: Insufficient documentation

## 2016-08-24 DIAGNOSIS — Z87891 Personal history of nicotine dependence: Secondary | ICD-10-CM | POA: Insufficient documentation

## 2016-08-24 DIAGNOSIS — J029 Acute pharyngitis, unspecified: Secondary | ICD-10-CM | POA: Diagnosis present

## 2016-08-24 LAB — COMPREHENSIVE METABOLIC PANEL
ALT: 23 U/L (ref 17–63)
AST: 27 U/L (ref 15–41)
Albumin: 4.4 g/dL (ref 3.5–5.0)
Alkaline Phosphatase: 60 U/L (ref 38–126)
Anion gap: 10 (ref 5–15)
BUN: 20 mg/dL (ref 6–20)
CHLORIDE: 97 mmol/L — AB (ref 101–111)
CO2: 28 mmol/L (ref 22–32)
Calcium: 9.7 mg/dL (ref 8.9–10.3)
Creatinine, Ser: 1.23 mg/dL (ref 0.61–1.24)
GFR, EST NON AFRICAN AMERICAN: 54 mL/min — AB (ref >60)
Glucose, Bld: 101 mg/dL — ABNORMAL HIGH (ref 65–99)
POTASSIUM: 2.8 mmol/L — AB (ref 3.5–5.1)
SODIUM: 135 mmol/L (ref 135–145)
Total Bilirubin: 1.6 mg/dL — ABNORMAL HIGH (ref 0.3–1.2)
Total Protein: 8 g/dL (ref 6.5–8.1)

## 2016-08-24 LAB — CBC WITH DIFFERENTIAL/PLATELET
Basophils Absolute: 0 10*3/uL (ref 0.0–0.1)
Basophils Relative: 0 %
Eosinophils Absolute: 0.2 10*3/uL (ref 0.0–0.7)
Eosinophils Relative: 2 %
HEMATOCRIT: 43 % (ref 39.0–52.0)
HEMOGLOBIN: 15 g/dL (ref 13.0–17.0)
LYMPHS ABS: 1 10*3/uL (ref 0.7–4.0)
LYMPHS PCT: 11 %
MCH: 32.5 pg (ref 26.0–34.0)
MCHC: 34.9 g/dL (ref 30.0–36.0)
MCV: 93.1 fL (ref 78.0–100.0)
Monocytes Absolute: 1.1 10*3/uL — ABNORMAL HIGH (ref 0.1–1.0)
Monocytes Relative: 12 %
NEUTROS PCT: 75 %
Neutro Abs: 6.9 10*3/uL (ref 1.7–7.7)
Platelets: 182 10*3/uL (ref 150–400)
RBC: 4.62 MIL/uL (ref 4.22–5.81)
RDW: 13.4 % (ref 11.5–15.5)
WBC: 9.2 10*3/uL (ref 4.0–10.5)

## 2016-08-24 LAB — RAPID STREP SCREEN (MED CTR MEBANE ONLY): STREPTOCOCCUS, GROUP A SCREEN (DIRECT): NEGATIVE

## 2016-08-24 LAB — TROPONIN I: Troponin I: <0.03 ng/mL (ref <0.03)

## 2016-08-24 LAB — BRAIN NATRIURETIC PEPTIDE: B Natriuretic Peptide: 11.7 pg/mL (ref 0.0–100.0)

## 2016-08-24 MED ORDER — BENZONATATE 100 MG PO CAPS: 100.0000 mg | ORAL_CAPSULE | Freq: Three times a day (TID) | ORAL | 0 refills | Status: DC

## 2016-08-24 MED ORDER — POTASSIUM CHLORIDE CRYS ER 20 MEQ PO TBCR
40.0000 meq | EXTENDED_RELEASE_TABLET | Freq: Once | ORAL | Status: AC
  Administered 2016-08-24: 40 meq via ORAL
  Filled 2016-08-24: qty 2

## 2016-08-24 MED ORDER — POTASSIUM CHLORIDE CRYS ER 20 MEQ PO TBCR: 20.0000 meq | EXTENDED_RELEASE_TABLET | Freq: Two times a day (BID) | ORAL | 0 refills | Status: DC

## 2016-08-24 NOTE — ED Notes (Signed)
Dr. Cardama at bedside.  

## 2016-08-24 NOTE — ED Notes (Signed)
Patient c/o congestion in throat and upper chest . States his throat is sore, this all started last pm. And has gotten worse over night.

## 2016-08-24 NOTE — ED Provider Notes (Signed)
[facility]-EMER[facility]ENCY DEPT Provider Note   CSN: [id number] Arrival date & time: 08/24/16  0720     History   Chief Complaint Chief Complaint  Patient presents with  . Sore Throat    HPI [name] is a 81 y.o. male.  He reports that yesterday morning he noticed reports hoarseness and his throat was hurting.  Over the past day his symptoms have become worse there associated with a nonproductive cough, and feelings of chest congestion.  He denies any fevers, nausea, vomiting, diarrhea, abdominal pain. No hot flashes or cold chills.  He reports chest pain that is a 1-2 out of 10 at rest that increases to a 4/5 out of 10 with coughing, feels sharp.  His chest pain does not radiate, he reports that it is improved by not coughing.  He reports less energy than normal.  Of note his wife is currently admitted to the hospital, he reports for infection, and he has been spending time in the hospital with her.        Past Medical History:  Diagnosis Date  . Chronic pain   . GAD (generalized anxiety disorder)   . GERD (gastroesophageal reflux disease)   . Hyperlipidemia   . Hypertension   . Low back pain radiating to left lower extremity   . Low back pain radiating to right lower extremity   . Osteoarthritis   . Peripheral polyneuropathy     Patient Active Problem List   Diagnosis Date Noted  . BPPV (benign paroxysmal positional vertigo) 12/15/2015  . Fatigue 09/23/2010  . Abnormal ECG 09/23/2010  . Hypertension 09/23/2010  . Hypercholesterolemia 09/23/2010  . ANXIETY 12/01/2009  . GASTRITIS 12/01/2009  . ABDOMINAL WALL PAIN 12/01/2009  . GERD 10/31/2009  . DIVERTICULOSIS-COLON 10/31/2009  . DEGENERATIVE JOINT DISEASE 10/31/2009  . DIARRHEA 10/31/2009    Past Surgical History:  Procedure Laterality Date  . CERVICAL FUSION    . DECUBITUS ULCER EXCISION    . EXTERNAL EAR SURGERY    . HEMORRHOID SURGERY    . NOSE SURGERY    . ROTATOR CUFF REPAIR         Home Medications     Prior to Admission medications   Medication Sig Start Date End Date Taking? Authorizing Provider  aspirin 81 MG tablet Take 81 mg by mouth daily.     Yes Historical Provider, MD  chlorthalidone (HYGROTON) 25 MG tablet Take 25 mg by mouth daily. 08/11/16  Yes Historical Provider, MD  clotrimazole-betamethasone (LOTRISONE) cream Apply 1 application topically as directed.     Yes Historical Provider, MD  diazepam (VALIUM) 5 MG tablet Take 5 mg by mouth every 6 (six) hours as needed.     Yes Historical Provider, MD  gabapentin (NEURONTIN) 300 MG capsule Take 300 mg by mouth 2 (two) times daily.   Yes Historical Provider, MD  metoprolol (LOPRESSOR) 50 MG tablet Take 50 mg by mouth 2 (two) times daily.     Yes Historical Provider, MD  pravastatin (PRAVACHOL) 40 MG tablet Take 40 mg by mouth daily.     Yes Historical Provider, MD  traMADol (ULTRAM) 50 MG tablet Take 50 mg by mouth every 6 (six) hours as needed.     Yes Historical Provider, MD  ergocalciferol (VITAMIN D2) 50000 UNITS capsule Take 50,000 Units by mouth once a week. ( ON HOLD )    Historical Provider, MD  omega-3 acid ethyl esters (LOVAZA) 1 G capsule Take 2 g by mouth 2 (two) times  daily.      Historical Provider, MD    Family History Family History  Problem Relation Age of Onset  . Heart attack Brother 70    CABG x5  . Coronary artery disease Brother   . Heart attack Brother 45    stent    Social History Social History  Substance Use Topics  . Smoking status: Former Smoker    Packs/day: 1.00    Years: 30.00    Types: Cigarettes    Quit date: 09/23/1975  . Smokeless tobacco: Never Used  . Alcohol use No     Allergies   Prednisone; Niaspan [niacin er]; and Cymbalta [duloxetine hcl]   Review of Systems Review of Systems  Constitutional: Positive for fatigue. Negative for chills, diaphoresis and fever.  HENT: Positive for sore throat and voice change. Negative for ear pain.   Eyes: Negative for pain and visual  disturbance.  Respiratory: Positive for cough. Negative for chest tightness, shortness of breath and stridor.   Cardiovascular: Positive for chest pain. Negative for palpitations and leg swelling.  Gastrointestinal: Negative for abdominal pain, diarrhea, nausea and vomiting.  Genitourinary: Negative for dysuria and hematuria.  Musculoskeletal: Negative for arthralgias, back pain, neck pain and neck stiffness.  Skin: Negative for color change and rash.  Neurological: Negative for dizziness, seizures, syncope, light-headedness and headaches.  All other systems reviewed and are negative.    Physical Exam Updated Vital Signs BP 111/83 (BP Location: Left Arm)   Pulse 84   Temp 98.2 F (36.8 C) (Oral)   Resp 20   SpO2 95%   Physical Exam  Constitutional: He appears well-developed and well-nourished.  HENT:  Head: Normocephalic and atraumatic.  Right Ear: External ear normal.  Left Ear: Tympanic membrane, external ear and ear canal normal.  Nose: Nose normal.  Mouth/Throat: Uvula is midline, oropharynx is clear and moist and mucous membranes are normal. No oropharyngeal exudate, posterior oropharyngeal edema, posterior oropharyngeal erythema or tonsillar abscesses.  R TM obscured by cerumen  Eyes: Conjunctivae are normal. Right eye exhibits no discharge. Left eye exhibits no discharge.  Neck: Neck supple. No JVD present.  Cardiovascular: Normal rate, regular rhythm, S1 normal, S2 normal, normal heart sounds and normal pulses.  Exam reveals no gallop and no friction rub.   No murmur heard. Pulmonary/Chest: Effort normal and breath sounds normal. No stridor. No respiratory distress. He has no wheezes. He exhibits no tenderness.  Abdominal: Soft. Bowel sounds are normal. There is no tenderness. There is no guarding.  Musculoskeletal: He exhibits no edema or deformity.  Lymphadenopathy:    He has no cervical adenopathy.  Neurological: He is alert. He exhibits normal muscle tone.  Skin:  Skin is warm and dry.  Psychiatric: He has a normal mood and affect.  Nursing note and vitals reviewed.    ED Treatments / Results  Labs (all labs ordered are listed, but only abnormal results are displayed) Labs Reviewed  CBC WITH DIFFERENTIAL/PLATELET - Abnormal; Notable for the following:       Result Value   Monocytes Absolute 1.1 (*)    All other components within normal limits  RAPID STREP SCREEN (NOT AT [facility])  CULTURE, GROUP A STREP [facility])  COMPREHENSIVE METABOLIC PANEL  BRAIN NATRIURETIC PEPTIDE  TROPONIN I    EKG  EKG Interpretation  Date/Time:  Tuesday Aug 24 2016 09:21:26 EDT Ventricular Rate:  87 PR Interval:  216 QRS Duration: 108 QT Interval:  392 QTC Calculation: 471 R Axis:   -57  Text Interpretation:  Sinus rhythm with 1st degree A-V block Left anterior fascicular block Minimal voltage criteria for LVH, may be normal variant Abnormal ECG Artifact NO STEMI Confirmed by [facility] MD, PEDRO ([id number]) on 08/24/2016 10:05:03 AM       Radiology Dg Chest 2 View  Result Date: 08/24/2016 CLINICAL DATA:  Cough.  Chest pain. EXAM: CHEST  2 VIEW COMPARISON:  None. FINDINGS: Normal heart size. Mildly tortuous thoracic aorta. Otherwise normal mediastinal contour. No pneumothorax. No pleural effusion. Lungs appear clear, with no acute consolidative airspace disease and no pulmonary edema. Surgical hardware from ACDF overlies the lower cervical spine. IMPRESSION: No active cardiopulmonary disease. Electronically Signed   By: Delbert Phenix M.D.   On: 08/24/2016 09:34    Procedures Procedures (including critical care time)  Medications Ordered in ED Medications - No data to display   Initial Impression / Assessment and Plan / ED Course  I have reviewed the triage vital signs and the nursing notes.  Pertinent labs & imaging results that were available during my care of the patient were reviewed by me and considered in my medical decision making (see chart for  details).  Clinical Course as of Aug 25 1054  Tue Aug 24, 2016  9528 Attempted to see patient, was not in room.  [EH]    Clinical Course User Index [EH] Cristina Gong, PA-C    Pt CXR negative for acute infiltrate. Patients symptoms are consistent with URI, likely viral etiology. Discussed that antibiotics are not indicated for viral infections. While in fast track patient was hemodynamically stable, in NAD.  Given patients chest pain with his age, medical history, the case was discussed with Dr. Eudelia Bunch who agreed to transfer patient to his care in pod E where he will determine final disposition after work up.     Final Clinical Impressions(s) / ED Diagnoses   Final diagnoses:  None    New Prescriptions New Prescriptions   No medications on file     Cristina Gong, PA-C 08/24/16 1057    Canary Brim Tegeler, MD 08/24/16 [phone]

## 2016-08-24 NOTE — ED Triage Notes (Signed)
Pt reports sore throat and congestion since yesterday, denies fever or chills. Pt a/ox4, resp e/u, nad.

## 2016-08-24 NOTE — ED Provider Notes (Signed)
Medical screening examination/treatment/procedure(s) were conducted as a shared visit with non-physician practitioner(s) and myself.  I personally evaluated the patient during the encounter. Briefly, the patient is a 81 y.o. male who presents to the ED with 2 days of coughing and chest discomfort. Cough is nonproductive. No known sick contacts however patient has been in the hospital with his wife for several weeks. Possible viral process. Chest x-ray without evidence of pneumonia. Cardiac workup revealed nonischemic EKG. troponin more than 24 hours after onset of chest pain was negative. Feel this is sufficient to rule out ACS. No physical evidence to suggest volume overload and patient had a normal BNP so I doubt heart failure. Her and her workup patient was noted to have hypokalemia which was repleted by mouth in the emergency department. Feel he is appropriate for discharge with strict return precautions.     EKG Interpretation  Date/Time:  Tuesday Aug 24 2016 12:00:27 EDT Ventricular Rate:  85 PR Interval:  216 QRS Duration: 115 QT Interval:  386 QTC Calculation: 459 R Axis:   -42 Text Interpretation:  Sinus rhythm Prolonged PR interval Incomplete RBBB and LAFB Left ventricular hypertrophy no changes from prior EKGs NO STEMI  Confirmed by [facility] MD, Kalmen Lollar ([id number]) on 08/24/2016 12:07:56 PM           Nira Conn, MD 08/24/16 1726

## 2016-08-24 NOTE — ED Notes (Signed)
PT taken to [facility] where his wife is a PT. PT has no questions at discharge. PT instructed to follow up with PCP. PT instructed to start potassium tomorrow as he has had his dose for today.

## 2016-08-26 LAB — CULTURE, GROUP A STREP (THRC)

## 2017-08-09 DIAGNOSIS — D649 Anemia, unspecified: Secondary | ICD-10-CM | POA: Insufficient documentation

## 2018-03-09 ENCOUNTER — Encounter: Payer: Self-pay | Admitting: Podiatry

## 2018-03-09 ENCOUNTER — Ambulatory Visit (INDEPENDENT_AMBULATORY_CARE_PROVIDER_SITE_OTHER): Payer: Medicare Other | Admitting: Podiatry

## 2018-03-09 VITALS — BP 108/68 | HR 67 | Resp 16

## 2018-03-09 DIAGNOSIS — M779 Enthesopathy, unspecified: Secondary | ICD-10-CM

## 2018-03-09 DIAGNOSIS — G8929 Other chronic pain: Secondary | ICD-10-CM | POA: Insufficient documentation

## 2018-03-09 DIAGNOSIS — G629 Polyneuropathy, unspecified: Secondary | ICD-10-CM

## 2018-03-09 DIAGNOSIS — F418 Other specified anxiety disorders: Secondary | ICD-10-CM | POA: Insufficient documentation

## 2018-03-09 MED ORDER — TRIAMCINOLONE ACETONIDE 10 MG/ML IJ SUSP
10.0000 mg | Freq: Once | INTRAMUSCULAR | Status: AC
  Administered 2018-03-09: 10 mg

## 2018-03-09 NOTE — Progress Notes (Signed)
   Subjective:    Patient ID: [name], male    DOB: [birth year], 82 y.o.   MRN: [id number]  HPI    Review of Systems  All other systems reviewed and are negative.      Objective:   Physical Exam        Assessment & Plan:

## 2018-03-09 NOTE — Progress Notes (Signed)
Subjective:   Patient ID: [name], male   DOB: 82 y.o.   MRN: [id number]   HPI Patient presents stating I have pain in both my feet for years and it burns and shoots at night and I tried gabapentin and other medicine without relief of symptoms and I seem to get pain in the front part of both feet with the right being worse.  This is been going on for a long time but is worsened over the last few years.  Patient does not smoke and likes to be active   Review of Systems  All other systems reviewed and are negative.       Objective:  Physical Exam  Constitutional: He appears well-developed and well-nourished.  Cardiovascular: Intact distal pulses.  Pulmonary/Chest: Effort normal.  Musculoskeletal: Normal range of motion.  Neurological: He is alert.  Skin: Skin is warm.  Nursing note and vitals reviewed.   Vascular status was found to be intact DP PT pulses with patient noted to have significant reduction of sharp dull vibratory DTR reflexes bilateral.  Patient is quite a bit of forefoot pain around the third MPJ right fourth MPJ left which may be part of the pathological process but seems to have just chronic neuropathic-like pain bilateral     Assessment:  Chronic neuropathic pain with possibility for capsulitis as a secondary factor     Plan:  H&P x-rays reviewed and at this point I will get a focus on the joint and I did inject the third MPJ 3 mg Kenalog 5 mg Xylocaine in a periarticular fashion if this gives relief we can consider doing this in the future.  Patient will be seen back to recheck and I do not recommend any further medicines as he has not responded to anything he is been given  X-ray indicates there is mild osteoporosis arthritis but no indications of stress fracture or acute injury

## 2019-08-24 ENCOUNTER — Encounter: Payer: Self-pay | Admitting: Orthopaedic Surgery

## 2019-08-24 ENCOUNTER — Ambulatory Visit (INDEPENDENT_AMBULATORY_CARE_PROVIDER_SITE_OTHER): Payer: Medicare Other | Admitting: Orthopaedic Surgery

## 2019-08-24 ENCOUNTER — Other Ambulatory Visit: Payer: Self-pay

## 2019-08-24 ENCOUNTER — Ambulatory Visit: Payer: Self-pay

## 2019-08-24 VITALS — BP 124/79 | HR 79 | Ht 74.0 in | Wt 247.0 lb

## 2019-08-24 DIAGNOSIS — G8929 Other chronic pain: Secondary | ICD-10-CM | POA: Diagnosis not present

## 2019-08-24 DIAGNOSIS — M533 Sacrococcygeal disorders, not elsewhere classified: Secondary | ICD-10-CM

## 2019-08-24 DIAGNOSIS — M4316 Spondylolisthesis, lumbar region: Secondary | ICD-10-CM | POA: Diagnosis not present

## 2019-08-24 DIAGNOSIS — M4807 Spinal stenosis, lumbosacral region: Secondary | ICD-10-CM | POA: Diagnosis not present

## 2019-08-24 DIAGNOSIS — M545 Low back pain: Secondary | ICD-10-CM

## 2019-08-24 NOTE — Progress Notes (Signed)
Office Visit Note   Patient: [name]           Date of Birth: 16-Jul-1935           MRN: [id number] Visit Date: 08/24/2019              Requested by: Delorse Lek, MD [address] [place],  [place] [address] PCP: Delorse Lek, MD   Assessment & Plan: Visit Diagnoses:  1. Chronic bilateral low back pain, unspecified whether sciatica present   2. Spinal stenosis of lumbosacral region   3. Chronic SI joint pain   4. Spondylolisthesis, lumbar region     Plan: With patient's worsening complaint of failed response with conservative treatment with multiple ESI's, medication management and activity modification I recommend getting a new lumbar MRI and comparing to the study that was done in 2017.  Patient will follow-up in the office in a few weeks with Dr. Ophelia Charter to discuss results and further treatment options.  Since he is having a considerable amount of pain over the bilateral SI joints as well I would recommend seeing if we can get Dr. Ollen Bowl (pain management specialist) could possibly do a diagnostic/therapeutic bilateral SI joint Marcaine/Depo-Medrol injection.  Our assistant will get a hold of his office and see if she can possibly help facilitate this.  All questions answered.  Follow-Up Instructions: Return in about 3 weeks (around 09/14/2019) for with dr yates to review lumbar mri scan.   Orders:  Orders Placed This Encounter  Procedures  . XR Lumbar Spine Complete  . MR LUMBAR SPINE WO CONTRAST   No orders of the defined types were placed in this encounter.     Procedures: No procedures performed   Clinical Data: No additional findings.   Subjective: Chief Complaint  Patient presents with  . Lower Back - Pain    HPI 84 year old white male is new patient the office comes in with complaints of worsening low back pain, bilateral lower extremity radiculopathy, neurogenic claudication.  Patient has known history of lumbar spondylosis and has  been treated in the past by Dr. Hilda Lias and is currently being followed by Dr. Ollen Bowl with pain management.  Patient had a lumbar MRI scan performed in 2017 which showed;  EXAM: MRI LUMBAR SPINE WITHOUT CONTRAST  TECHNIQUE: Multiplanar, multisequence MR imaging of the lumbar spine was performed. No intravenous contrast was administered.  COMPARISON:  Postmyelogram CT scan 05/26/2012. Plain films lumbar spine from an outside facility 06/05/2015.  FINDINGS: Facet degenerative disease results in 0.4 cm anterolisthesis L3 on L4. Alignment is otherwise unremarkable. There is convex left scoliosis. Vertebral body height is maintained. No worrisome marrow lesion is seen with some degenerative endplate signal change noted at L5-S1. The conus medullaris is normal in signal and position. Imaged intra-abdominal contents demonstrate a small exophytic cyst off the left kidney, unchanged.  T11-12 and T12-L1 are imaged in the sagittal plane only. Minimal disc bulge is seen at T11-12 but the central canal and foramina appear open at each level.  L1-2:  Negative.  L2-3: There is a shallow disc bulge and mild facet degenerative disease. The central canal and foramina are widely patent.  L3-4: Moderate to moderately severe facet arthropathy is present. There is a disc bulge with some ligamentum flavum thickening. Mild central canal and mild to moderate bilateral foraminal narrowing, more notable on the left, is seen. The appearance is not notably changed.  L4-5: Moderate to moderately severe bilateral  facet degenerative change is present. There is a shallow disc bulge without central canal stenosis. The left foramen is widely patent. Mild right foraminal narrowing is noted. The appearance is unchanged.  L5-S1: There is partial autologous fusion across the disc interspace. Mild endplate spurring is present but the central canal and foramina are patent.  IMPRESSION: No  change in the appearance of the lumbar spine since the prior examination.  Mild central canal and mild to moderate bilateral foraminal narrowing, worse on the left, at L3-4. Moderate to moderately severe facet degenerative change is also seen at this level.  Moderate to moderately severe bilateral facet arthropathy L4-5 where there is a shallow disc bulge without central canal or left foraminal narrowing. Mild right foraminal narrowing is seen at this level.   Patient states that he has not had any surgical procedures for his lumbar spine.  He has received multiple lumbar ESI's with Dr. Ollen Bowl.  Most recent Jan 2021.  States that pain management PA gave him samples of new drug Horizant (gabapentin enacarbil) that did help with some of the pain but had more drowsiness.  States is there was some issue with getting this approved through his insurance.  States that he also has not been getting any relief with the ESI's.  He has progressively worsening low back pain where he localizes mostly around the bilateral SI joints but he also has pain that radiates down both legs to his feet.  This is worse when he is bending and ambulating.  He also gets numbness and tingling down into his legs.  States that when he does walk in a grocery store which he tries to avoid he does have to hold onto a cart leaning forward in hopes of relieving some of his leg pain and cramping.  He has not had an updated MRI.     Review of Systems No current cardiac pulmonary GI GU  Objective: Vital Signs: BP 124/79   Pulse 79   Ht 6\' 2"  (1.88 m)   Wt 247 lb (112 kg)   BMI 31.71 kg/m   Physical Exam HENT:     Head: Normocephalic.  Pulmonary:     Effort: No respiratory distress.  Musculoskeletal:     Comments: Gait is antalgic with a cane.  He does have some lumbar paraspinal tenderness.  He has moderate to marked tender over the bilateral SI joints.  Negative logroll.  Does have positive bilateral straight leg  raise.  Bilateral calves are nontender.  No focal motor deficits.  Neurological:     General: No focal deficit present.     Mental Status: He is alert and oriented to person, place, and time.     Ortho Exam  Specialty Comments:  No specialty comments available.  Imaging: No results found.   PMFS History: Patient Active Problem List   Diagnosis Date Noted  . Anxiety with depression 03/09/2018  . Chronic pain 03/09/2018  . Normocytic anemia 08/09/2017  . BPPV (benign paroxysmal positional vertigo) 12/15/2015  . Peripheral polyneuropathy 09/24/2015  . Fatigue 09/23/2010  . Abnormal ECG 09/23/2010  . Hypertension 09/23/2010  . Hypercholesterolemia 09/23/2010  . ANXIETY 12/01/2009  . GASTRITIS 12/01/2009  . ABDOMINAL WALL PAIN 12/01/2009  . GERD 10/31/2009  . DIVERTICULOSIS-COLON 10/31/2009  . DEGENERATIVE JOINT DISEASE 10/31/2009  . DIARRHEA 10/31/2009   Past Medical History:  Diagnosis Date  . Chronic pain   . GAD (generalized anxiety disorder)   . GERD (gastroesophageal reflux disease)   . Hyperlipidemia   .  Hypertension   . Low back pain radiating to left lower extremity   . Low back pain radiating to right lower extremity   . Osteoarthritis   . Peripheral polyneuropathy     Family History  Problem Relation Age of Onset  . Heart attack Brother 70       CABG x5  . Coronary artery disease Brother   . Heart attack Brother 75       stent    Past Surgical History:  Procedure Laterality Date  . CERVICAL FUSION    . DECUBITUS ULCER EXCISION    . EXTERNAL EAR SURGERY    . HEMORRHOID SURGERY    . NOSE SURGERY    . ROTATOR CUFF REPAIR     Social History   Occupational History  . Occupation: tool and Therapist, nutritional    Comment: retired  Tobacco Use  . Smoking status: Former Smoker    Packs/day: 1.00    Years: 30.00    Pack years: 30.00    Types: Cigarettes    Quit date: 09/23/1975    Years since quitting: 43.9  . Smokeless tobacco: Never Used    Substance and Sexual Activity  . Alcohol use: No  . Drug use: No  . Sexual activity: Not on file

## 2019-09-21 ENCOUNTER — Other Ambulatory Visit: Payer: Self-pay

## 2019-09-21 ENCOUNTER — Ambulatory Visit
Admission: RE | Admit: 2019-09-21 | Discharge: 2019-09-21 | Disposition: A | Discharge status: Home / Self Care | Payer: Medicare Other | Source: Ambulatory Visit | Attending: Surgery | Admitting: Surgery

## 2019-09-21 DIAGNOSIS — M4807 Spinal stenosis, lumbosacral region: Secondary | ICD-10-CM

## 2019-09-25 ENCOUNTER — Ambulatory Visit (INDEPENDENT_AMBULATORY_CARE_PROVIDER_SITE_OTHER): Payer: Medicare Other | Admitting: Orthopaedic Surgery

## 2019-09-25 ENCOUNTER — Ambulatory Visit (INDEPENDENT_AMBULATORY_CARE_PROVIDER_SITE_OTHER): Payer: Medicare Other

## 2019-09-25 ENCOUNTER — Other Ambulatory Visit: Payer: Self-pay

## 2019-09-25 ENCOUNTER — Ambulatory Visit: Payer: Self-pay

## 2019-09-25 VITALS — Ht 74.0 in | Wt 247.0 lb

## 2019-09-25 DIAGNOSIS — M170 Bilateral primary osteoarthritis of knee: Secondary | ICD-10-CM | POA: Insufficient documentation

## 2019-09-25 DIAGNOSIS — M25561 Pain in right knee: Secondary | ICD-10-CM | POA: Diagnosis not present

## 2019-09-25 DIAGNOSIS — M25562 Pain in left knee: Secondary | ICD-10-CM

## 2019-09-25 MED ORDER — METHYLPREDNISOLONE ACETATE 40 MG/ML IJ SUSP
40.0000 mg | INTRAMUSCULAR | Status: AC | PRN
  Administered 2019-09-25: 40 mg via INTRA_ARTICULAR

## 2019-09-25 MED ORDER — LIDOCAINE HCL 1 % IJ SOLN
0.5000 mL | INTRAMUSCULAR | Status: AC | PRN
  Administered 2019-09-25: .5 mL

## 2019-09-25 MED ORDER — BUPIVACAINE HCL 0.5 % IJ SOLN
3.0000 mL | INTRAMUSCULAR | Status: AC | PRN
Start: 2019-09-25 — End: 2019-09-25
  Administered 2019-09-25: 3 mL via INTRA_ARTICULAR

## 2019-09-25 NOTE — Progress Notes (Signed)
Office Visit Note   Patient: [name]           Date of Birth: April 28, 1935           MRN: [id number] Visit Date: 09/25/2019              Requested by: Delorse Lek, MD [address] [place],  [place] [address] PCP: Gwenlyn Found, MD   Assessment & Plan: Visit Diagnoses:  1. Left knee pain, unspecified chronicity   2. Right knee pain, unspecified chronicity   3. Bilateral primary osteoarthritis of knee     Plan: We reviewed his MRI scan at this point I do not recommend any operative intervention.  He has mild to moderate narrowing at several levels but none with significant compression.  We will set him up for intra-articular injection today in his left knee recheck him in 3 weeks and set him up for some physical therapy for lower extremity strengthening and core strengthening.  Follow-Up Instructions: Return in about 3 weeks (around 10/16/2019).   Orders:  Orders Placed This Encounter  Procedures  . Large Joint Inj  . XR Knee 1-2 Views Right  . XR Knee 1-2 Views Left   No orders of the defined types were placed in this encounter.     Procedures: Large Joint Inj: L knee on 09/25/2019 1:34 PM Indications: joint swelling and pain Details: 22 G 1.5 in needle, anterolateral approach  Arthrogram: No  Medications: 0.5 mL lidocaine 1 %; 3 mL bupivacaine 0.5 %; 40 mg methylPREDNISolone acetate 40 MG/ML Outcome: tolerated well, no immediate complications Procedure, treatment alternatives, risks and benefits explained, specific risks discussed. Consent was given by the patient. Immediately prior to procedure a time out was called to verify the correct patient, procedure, equipment, support staff and site/side marked as required. Patient was prepped and draped in the usual sterile fashion.       Clinical Data: No additional findings.   Subjective: No chief complaint on file.   HPI 84 year old male returns with ongoing problems with chronic back  pain.  New MRI scan has been obtained and is available for review.  He has problems with bilateral knee osteoarthritis worse on the left and right knee and has been amatory with a cane for several years.  He said history of multiple injections in the past in the lumbar spine.  Patient has a son who had some cervical spine surgery and has problems with mobility and can only stand but cannot walk and he has to help provide care for him.  Patient denies associated bowel bladder symptoms with his back.  No chills or fever.  Epidural injections have sometimes given him some relief.  Patient's not been through any therapy.  New MRI from 09/21/2019 shows diffuse lumbar spondylosis with some areas of disc bulge arthropathy facet effusion and mild to moderate narrowing but no areas of moderate to severe narrowing.  Held 3-4 seems to be his worst level but is only rated at mild to moderate narrowing with AP diameter of greater than 12 mm.  Patient's wife passed away in the last year.  He has a sister who also helps take care of his son who has some quadriparesis.  Review of Systems 14 point system update unchanged from 08/24/2019.  Objective: Vital Signs: Ht 6\' 2"  (1.88 m)   Wt 247 lb (112 kg)   BMI 31.71 kg/m   Physical Exam Constitutional:  Appearance: He is well-developed.  HENT:     Head: Normocephalic and atraumatic.  Eyes:     Pupils: Pupils are equal, round, and reactive to light.  Neck:     Thyroid: No thyromegaly.     Trachea: No tracheal deviation.  Cardiovascular:     Rate and Rhythm: Normal rate.  Pulmonary:     Effort: Pulmonary effort is normal.     Breath sounds: No wheezing.  Abdominal:     General: Bowel sounds are normal.     Palpations: Abdomen is soft.  Skin:    General: Skin is warm and dry.     Capillary Refill: Capillary refill takes less than 2 seconds.  Neurological:     Mental Status: He is alert and oriented to person, place, and time.  Psychiatric:         Behavior: Behavior normal.        Thought Content: Thought content normal.        Judgment: Judgment normal.     Ortho Exam patient has bilateral knee crepitus he says fairly good quad strength right and left.  Anterior tib gastrocsoleus is active negative logroll to the hips.  There is medial more than lateral joint line tenderness.  He lacks 10 degrees knee extension right left.  Specialty Comments:  No specialty comments available.  Imaging: CLINICAL DATA:  Bilateral lower extremity radiculopathy. Worsening pain.  EXAM: MRI LUMBAR SPINE WITHOUT CONTRAST  TECHNIQUE: Multiplanar, multisequence MR imaging of the lumbar spine was performed. No intravenous contrast was administered.  COMPARISON:  06/14/2015  FINDINGS: Segmentation:  Standard.  Alignment: Minimal grade 1 anterolisthesis of L3 on L4 secondary to facet disease.  Vertebrae:  No fracture, evidence of discitis, or bone lesion.  Conus medullaris and cauda equina: Conus extends to the L1 level. Conus and cauda equina appear normal.  Paraspinal and other soft tissues: No acute paraspinal abnormality. Small left renal cyst.  Disc levels:  Disc spaces: Degenerative disease with disc height loss at L5-S1. Disc desiccation at L2-3, L3-4 and L4-5.  T11-12: Minimal broad-based disc bulge. No central canal stenosis. Bilateral mild facet arthropathy. Mild left foraminal stenosis. No right foraminal stenosis.  T12-L1: No significant disc bulge. No evidence of neural foraminal stenosis. No central canal stenosis.  L1-L2: Mild broad-based disc bulge. No evidence of neural foraminal stenosis. No central canal stenosis.  L2-L3: Broad-based disc bulge eccentric towards the right. Severe right and moderate left facet arthropathy. Bilateral subarticular recess narrowing. No evidence of neural foraminal stenosis. Mild spinal stenosis.  L3-L4: Mild broad-based disc bulge. Severe bilateral  facet arthropathy with bilateral facet effusions. Mild-moderate spinal stenosis. Bilateral subarticular recess stenosis. Mild right and moderate left foraminal stenosis.  L4-L5: Broad-based disc bulge. Moderate bilateral facet arthropathy with bilateral facet effusions. Mild right foraminal stenosis. No left foraminal stenosis. No central canal stenosis.  L5-S1: Broad-based disc osteophyte complex. Mild left foraminal stenosis. No right foraminal stenosis. No central canal stenosis.  IMPRESSION: 1. Diffuse lumbar spine spondylosis as described above. 2. No acute osseous injury of the lumbar spine.   Electronically Signed   By: Kathreen Devoid   On: 09/22/2019 08:59   PMFS History: Patient Active Problem List   Diagnosis Date Noted  . Bilateral primary osteoarthritis of knee 09/25/2019  . Anxiety with depression 03/09/2018  . Chronic pain 03/09/2018  . Normocytic anemia 08/09/2017  . BPPV (benign paroxysmal positional vertigo) 12/15/2015  . Peripheral polyneuropathy 09/24/2015  . Fatigue 09/23/2010  . Abnormal ECG 09/23/2010  .  Hypertension 09/23/2010  . Hypercholesterolemia 09/23/2010  . ANXIETY 12/01/2009  . GASTRITIS 12/01/2009  . ABDOMINAL WALL PAIN 12/01/2009  . GERD 10/31/2009  . DIVERTICULOSIS-COLON 10/31/2009  . DEGENERATIVE JOINT DISEASE 10/31/2009  . DIARRHEA 10/31/2009   Past Medical History:  Diagnosis Date  . Chronic pain   . GAD (generalized anxiety disorder)   . GERD (gastroesophageal reflux disease)   . Hyperlipidemia   . Hypertension   . Low back pain radiating to left lower extremity   . Low back pain radiating to right lower extremity   . Osteoarthritis   . Peripheral polyneuropathy     Family History  Problem Relation Age of Onset  . Heart attack Brother 70       CABG x5  . Coronary artery disease Brother   . Heart attack Brother 74       stent    Past Surgical History:  Procedure Laterality Date  . CERVICAL FUSION    . DECUBITUS  ULCER EXCISION    . EXTERNAL EAR SURGERY    . HEMORRHOID SURGERY    . NOSE SURGERY    . ROTATOR CUFF REPAIR     Social History   Occupational History  . 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 Smoking status: Former Smoker    Packs/day: 1.00    Years: 30.00    Pack years: 30.00    Types: Cigarettes    Quit date: 09/23/1975    Years since quitting: 44.0  . Smokeless tobacco: Never Used  Substance and Sexual Activity  . Alcohol use: No  . Drug use: No  . Sexual activity: Not on file

## 2019-10-16 ENCOUNTER — Ambulatory Visit: Payer: Commercial Indemnity | Admitting: Orthopaedic Surgery

## 2020-01-16 NOTE — Progress Notes (Addendum)
COVID Vaccine Completed: x2 Date COVID Vaccine completed:  05-16-19 & 06-06-19 [facility] COVID vaccine manufacturer: Pfizer    Moderna   [employer]   PCP - Brett Fairy, MD Cardiologist -   Chest x-ray -  EKG -  01-17-20 in Epic Stress Test -  ECHO -  Cardiac Cath -  Pacemaker/ICD device last checked:  Sleep Study -  CPAP -   Fasting Blood Sugar -  Checks Blood Sugar _____ times a day  Blood Thinner Instructions: Aspirin Instructions: Last Dose:  Anesthesia review:   Patient denies shortness of breath, fever, cough and chest pain at PAT appointment   Patient verbalized understanding of instructions that were given to them at the PAT appointment. Patient was also instructed that they will need to review over the PAT instructions again at home before surgery.

## 2020-01-16 NOTE — Patient Instructions (Addendum)
DUE TO COVID-19 ONLY ONE VISITOR IS ALLOWED TO COME WITH YOU AND STAY IN THE WAITING ROOM ONLY DURING PRE OP AND PROCEDURE.   IF YOU WILL BE ADMITTED INTO THE HOSPITAL YOU ARE ALLOWED ONE SUPPORT PERSON DURING VISITATION HOURS ONLY (10AM -8PM)    The support person may change daily.  The support person must pass our screening, gel in and out, and wear a mask at all times, including in the patients room.  Patients must also wear a mask when staff or their support person are in the room.   COVID SWAB TESTING MUST BE COMPLETED ON:  Tuesday, 01-22-20 @ 11:00 AM   [address] [place], [place] [address]  (Must self quarantine after testing. Follow instructions on handout.)        Your procedure is scheduled on: Friday, 01-25-20   Report to [facility] Main  Entrance   Report to Short Stay at 5:30 AM   [facility])    Call this number if you have problems the morning of surgery [phone]   Do not eat food :After Midnight.   May have liquids until 4:30 AM  day of surgery  CLEAR LIQUID DIET  Foods Allowed                                                                     Foods Excluded  Water, Black Coffee and tea, regular and decaf             liquids that you cannot  Plain Jell-O in any flavor  (No red)                                    see through such as: Fruit ices (not with fruit pulp)                                      milk, soups, orange juice              Iced Popsicles (No red)                                      All solid food                                   Apple juices Sports drinks like Gatorade (No red) Lightly seasoned clear broth or consume(fat free) Sugar, honey syrup   Complete one Ensure drink the morning of surgery at  4:30 AM  the day of surgery.     Oral Hygiene is also important to reduce your risk of infection.                                    Remember - BRUSH YOUR TEETH THE MORNING OF SURGERY WITH YOUR REGULAR TOOTHPASTE   Do NOT  smoke after Midnight  Take these medicines the morning of surgery with A SIP OF WATER: None                                You may not have any metal on your body including jewelry, and body piercings             Do not wear lotions, powders, perfumes/cologne, or deodorant             Men may shave face and neck.   Do not bring valuables to the hospital. [facility] IS NOT RESPONSIBLE   FOR VALUABLES.   Contacts, dentures or bridgework may not be worn into surgery.   Patients discharged the day of surgery will not be allowed to drive home.                Please read over the following fact sheets you were given: IF YOU HAVE QUESTIONS ABOUT YOUR PRE OP INSTRUCTIONS PLEASE CALL [phone]   [place]- Preparing for Total Shoulder Arthroplasty    Before surgery, you can play an important role. Because skin is not sterile, your skin needs to be as free of germs as possible. You can reduce the number of germs on your skin by using the following products.  Benzoyl Peroxide Gel o Reduces the number of germs present on the skin o Applied twice a day to shoulder area starting two days before surgery    ==================================================================  Please follow these instructions carefully:  BENZOYL PEROXIDE 5% GEL  Please do not use if you have an allergy to benzoyl peroxide.   If your skin becomes reddened/irritated stop using the benzoyl peroxide.  Starting two days before surgery, apply as follows: 1. Apply benzoyl peroxide in the morning and at night. Apply after taking a shower. If you are not taking a shower clean entire shoulder front, back, and side along with the armpit with a clean wet washcloth.  2. Place a quarter-sized dollop on your shoulder and rub in thoroughly, making sure to cover the front, back, and side of your shoulder, along with the armpit.   2 days before ____ AM   ____ PM              1 day before ____ AM   ____ PM                          3. Do this twice a day for two days.  (Last application is the night before surgery, AFTER using the CHG soap as described below).  4. Do NOT apply benzoyl peroxide gel on the day of surgery.    - Preparing for Surgery Before surgery, you can play an important role.  Because skin is not sterile, your skin needs to be as free of germs as possible.  You can reduce the number of germs on your skin by washing with CHG (chlorahexidine gluconate) soap before surgery.  CHG is an antiseptic cleaner which kills germs and bonds with the skin to continue killing germs even after washing. Please DO NOT use if you have an allergy to CHG or antibacterial soaps.  If your skin becomes reddened/irritated stop using the CHG and inform your nurse when you arrive at Short Stay. Do not shave (including legs and underarms) for at least 48 hours prior to the first CHG shower.  You may shave your face/neck.  Please follow these instructions carefully:  1.  Shower with CHG Soap the night before surgery and the  morning of surgery.  2.  If you choose to wash your hair, wash your hair first as usual with your normal  shampoo.  3.  After you shampoo, rinse your hair and body thoroughly to remove the shampoo.                             4.  Use CHG as you would any other liquid soap.  You can apply chg directly to the skin and wash.  Gently with a scrungie or clean washcloth.  5.  Apply the CHG Soap to your body ONLY FROM THE NECK DOWN.   Do   not use on face/ open                           Wound or open sores. Avoid contact with eyes, ears mouth and   genitals (private parts).                       Wash face,  Genitals (private parts) with your normal soap.             6.  Wash thoroughly, paying special attention to the area where your    surgery  will be performed.  7.  Thoroughly rinse your body with warm water from the neck down.  8.  DO NOT shower/wash with your normal soap after using and rinsing  off the CHG Soap.                9.  Pat yourself dry with a clean towel.            10.  Wear clean pajamas.            11.  Place clean sheets on your bed the night of your first shower and do not  sleep with pets. Day of Surgery : Do not apply any lotions/deodorants the morning of surgery.  Please wear clean clothes to the hospital/surgery center.  FAILURE TO FOLLOW THESE INSTRUCTIONS MAY RESULT IN THE CANCELLATION OF YOUR SURGERY  PATIENT SIGNATURE_________________________________  NURSE SIGNATURE__________________________________  ________________________________________________________________________   Rogelia Mire  An incentive spirometer is a tool that can help keep your lungs clear and active. This tool measures how well you are filling your lungs with each breath. Taking long deep breaths may help reverse or decrease the chance of developing breathing (pulmonary) problems (especially infection) following:  A long period of time when you are unable to move or be active. BEFORE THE PROCEDURE   If the spirometer includes an indicator to show your best effort, your nurse or respiratory therapist will set it to a desired goal.  If possible, sit up straight or lean slightly forward. Try not to slouch.  Hold the incentive spirometer in an upright position. INSTRUCTIONS FOR USE  1. Sit on the edge of your bed if possible, or sit up as far as you can in bed or on a chair. 2. Hold the incentive spirometer in an upright position. 3. Breathe out normally. 4. Place the mouthpiece in your mouth and seal your lips tightly around it. 5. Breathe in slowly and as deeply as possible, raising the piston or the ball toward the top of the column. 6. Hold your breath for 3-5 seconds or for  as long as possible. Allow the piston or ball to fall to the bottom of the column. 7. Remove the mouthpiece from your mouth and breathe out normally. 8. Rest for a few seconds and repeat Steps 1  through 7 at least 10 times every 1-2 hours when you are awake. Take your time and take a few normal breaths between deep breaths. 9. The spirometer may include an indicator to show your best effort. Use the indicator as a goal to work toward during each repetition. 10. After each set of 10 deep breaths, practice coughing to be sure your lungs are clear. If you have an incision (the cut made at the time of surgery), support your incision when coughing by placing a pillow or rolled up towels firmly against it. Once you are able to get out of bed, walk around indoors and cough well. You may stop using the incentive spirometer when instructed by your caregiver.  RISKS AND COMPLICATIONS  Take your time so you do not get dizzy or light-headed.  If you are in pain, you may need to take or ask for pain medication before doing incentive spirometry. It is harder to take a deep breath if you are having pain. AFTER USE  Rest and breathe slowly and easily.  It can be helpful to keep track of a log of your progress. Your caregiver can provide you with a simple table to help with this. If you are using the spirometer at home, follow these instructions: SEEK MEDICAL CARE IF:   You are having difficultly using the spirometer.  You have trouble using the spirometer as often as instructed.  Your pain medication is not giving enough relief while using the spirometer.  You develop fever of 100.5 F (38.1 C) or higher. SEEK IMMEDIATE MEDICAL CARE IF:   You cough up bloody sputum that had not been present before.  You develop fever of 102 F (38.9 C) or greater.  You develop worsening pain at or near the incision site. MAKE SURE YOU:   Understand these instructions.  Will watch your condition.  Will get help right away if you are not doing well or get worse. Document Released: 08/23/2006 Document Revised: 07/05/2011 Document Reviewed: 10/24/2006 [facility] Patient Information 2014 [employer]  [place].   ________________________________________________________________________

## 2020-01-16 NOTE — H&P (Signed)
Patient's anticipated LOS is less than 2 midnights, meeting these requirements: - Younger than 29 - Lives within 1 hour of care - Has a competent adult at home to recover with post-op recover - NO history of  - Chronic pain requiring opiods  - Diabetes  - Coronary Artery Disease  - Heart failure  - Heart attack  - Stroke  - DVT/VTE  - Cardiac arrhythmia  - Respiratory Failure/COPD  - Renal failure  - Anemia  - Advanced Liver disease       [name] is an 84 y.o. male.    Chief Complaint: right shoulder pain  HPI: Pt is a 84 y.o. male complaining of right shoulder pain for multiple years. Pain had continually increased since the beginning. X-rays in the clinic show end-stage arthritic changes of the right shoulder. Pt has tried various conservative treatments which have failed to alleviate their symptoms, including injections and therapy. Various options are discussed with the patient. Risks, benefits and expectations were discussed with the patient. Patient understand the risks, benefits and expectations and wishes to proceed with surgery.   PCP:  Gwenlyn Found, MD  D/C Plans: Home  PMH: Past Medical History:  Diagnosis Date  . Chronic pain   . GAD (generalized anxiety disorder)   . GERD (gastroesophageal reflux disease)   . Hyperlipidemia   . Hypertension   . Low back pain radiating to left lower extremity   . Low back pain radiating to right lower extremity   . Osteoarthritis   . Peripheral polyneuropathy     PSH: Past Surgical History:  Procedure Laterality Date  . CERVICAL FUSION    . DECUBITUS ULCER EXCISION    . EXTERNAL EAR SURGERY    . HEMORRHOID SURGERY    . NOSE SURGERY    . ROTATOR CUFF REPAIR      Social History:  reports that he quit smoking about 44 years ago. His smoking use included cigarettes. He has a 30.00 pack-year smoking history. He has never used smokeless tobacco. He reports that he does not drink alcohol and does not use  drugs.  Allergies:  Allergies  Allergen Reactions  . Prednisone Other (See Comments)    Severe restlessness  . Niaspan [Niacin Er]     Restlessness  . Cephalexin Anxiety  . Cymbalta [Duloxetine Hcl] Other (See Comments)    constipation    Medications: No current facility-administered medications for this encounter.   Current Outpatient Medications  Medication Sig Dispense Refill  . chlorthalidone (HYGROTON) 25 MG tablet Take 25 mg by mouth daily.    . diazepam (VALIUM) 5 MG tablet Take 2.5 mg by mouth in the morning and at bedtime.     Marland Kitchen ibuprofen (ADVIL) 200 MG tablet Take 400 mg by mouth in the morning and at bedtime.    . sertraline (ZOLOFT) 50 MG tablet Take 50 mg by mouth at bedtime.    . traZODone (DESYREL) 50 MG tablet Take 50 mg by mouth at bedtime.       No results found for this or any previous visit (from the past 48 hour(s)). No results found.  ROS: Pain with rom of the right upper extremity  Physical Exam: Alert and oriented 84 y.o. male in no acute distress Cranial nerves 2-12 intact Cervical spine: full rom with no tenderness, nv intact distally Chest: active breath sounds bilaterally, no wheeze rhonchi or rales Heart: regular rate and rhythm, no murmur Abd: non tender non distended with active bowel  sounds Hip is stable with rom  Right shoulder with limited and painful rom nv intact distally No rashes or edema distally  Assessment/Plan Assessment: right shoulder cuff arthropathy  Plan:  Patient will undergo a right reverse total shoulder by Dr. Ranell Patrick at [facility] Risks benefits and expectations were discussed with the patient. Patient understand risks, benefits and expectations and wishes to proceed. Preoperative templating of the joint replacement has been completed, documented, and submitted to the Operating Room personnel in order to optimize intra-operative equipment management.   Alphonsa Overall PA-C, MPAS [facility] is now  [employer] [address], [address], [place], [place] [address] Phone: [phone] [email or web address] Facebook  [employer]

## 2020-01-17 ENCOUNTER — Other Ambulatory Visit: Payer: Self-pay

## 2020-01-17 ENCOUNTER — Encounter (HOSPITAL_COMMUNITY)
Admission: RE | Admit: 2020-01-17 | Discharge: 2020-01-17 | Disposition: A | Discharge status: Home / Self Care | Payer: Medicare Other | Source: Ambulatory Visit | Attending: Orthopedic Surgery | Admitting: Orthopedic Surgery

## 2020-01-17 ENCOUNTER — Encounter (HOSPITAL_COMMUNITY): Payer: Self-pay

## 2020-01-17 DIAGNOSIS — Z01818 Encounter for other preprocedural examination: Secondary | ICD-10-CM | POA: Diagnosis present

## 2020-01-17 DIAGNOSIS — I452 Bifascicular block: Secondary | ICD-10-CM | POA: Insufficient documentation

## 2020-01-17 HISTORY — DX: Depression, unspecified: F32.A

## 2020-01-17 LAB — BASIC METABOLIC PANEL
Anion gap: 13 (ref 5–15)
BUN: 22 mg/dL (ref 8–23)
CO2: 27 mmol/L (ref 22–32)
Calcium: 9.9 mg/dL (ref 8.9–10.3)
Chloride: 98 mmol/L (ref 98–111)
Creatinine, Ser: 1.21 mg/dL (ref 0.61–1.24)
GFR calc Af Amer: >60 mL/min (ref >60)
GFR calc non Af Amer: 55 mL/min — ABNORMAL LOW (ref >60)
Glucose, Bld: 92 mg/dL (ref 70–99)
Potassium: 3.1 mmol/L — ABNORMAL LOW (ref 3.5–5.1)
Sodium: 138 mmol/L (ref 135–145)

## 2020-01-17 LAB — CBC
HCT: 39.5 % (ref 39.0–52.0)
Hemoglobin: 13.9 g/dL (ref 13.0–17.0)
MCH: 33.1 pg (ref 26.0–34.0)
MCHC: 35.2 g/dL (ref 30.0–36.0)
MCV: 94 fL (ref 80.0–100.0)
Platelets: 234 10*3/uL (ref 150–400)
RBC: 4.2 MIL/uL — ABNORMAL LOW (ref 4.22–5.81)
RDW: 13 % (ref 11.5–15.5)
WBC: 10.1 10*3/uL (ref 4.0–10.5)
nRBC: 0 % (ref 0.0–0.2)

## 2020-01-17 LAB — SURGICAL PCR SCREEN
MRSA, PCR: POSITIVE — AB
Staphylococcus aureus: POSITIVE — AB

## 2020-01-22 ENCOUNTER — Inpatient Hospital Stay (HOSPITAL_COMMUNITY): Admission: RE | Admit: 2020-01-22 | Payer: Medicare Other | Source: Ambulatory Visit

## 2020-01-24 ENCOUNTER — Encounter (HOSPITAL_COMMUNITY): Payer: Self-pay | Admitting: Orthopedic Surgery

## 2020-01-24 MED ORDER — VANCOMYCIN HCL 1500 MG/300ML IV SOLN
1500.0000 mg | INTRAVENOUS | Status: AC
  Administered 2020-01-25: 1500 mg via INTRAVENOUS
  Filled 2020-01-24: qty 300

## 2020-01-24 NOTE — Anesthesia Preprocedure Evaluation (Addendum)
Anesthesia Evaluation  Patient identified by MRN, date of birth, ID band Patient awake    Reviewed: Allergy & Precautions, NPO status , Patient's Chart, lab work & pertinent test results  Airway Mallampati: I       Dental  (+) Edentulous Upper, Edentulous Lower   Pulmonary former smoker,    Pulmonary exam normal        Cardiovascular hypertension, Pt. on medications Normal cardiovascular exam     Neuro/Psych PSYCHIATRIC DISORDERS Anxiety Depression    GI/Hepatic Neg liver ROS,   Endo/Other  negative endocrine ROS  Renal/GU negative Renal ROS  negative genitourinary   Musculoskeletal  (+) Arthritis , Osteoarthritis,    Abdominal (+) + obese,   Peds  Hematology   Anesthesia Other Findings   Reproductive/Obstetrics                            Anesthesia Physical Anesthesia Plan  ASA: II  Anesthesia Plan: General   Post-op Pain Management:  Regional for Post-op pain   Induction: Intravenous  PONV Risk Score and Plan: 2 and Ondansetron, Dexamethasone and Treatment may vary due to age or medical condition  Airway Management Planned: Oral ETT  Additional Equipment: None  Intra-op Plan:   Post-operative Plan: Extubation in OR  Informed Consent: I have reviewed the patients History and Physical, chart, labs and discussed the procedure including the risks, benefits and alternatives for the proposed anesthesia with the patient or authorized representative who has indicated his/her understanding and acceptance.     Dental advisory given  Plan Discussed with: CRNA  Anesthesia Plan Comments:        Anesthesia Quick Evaluation

## 2020-01-25 ENCOUNTER — Ambulatory Visit (HOSPITAL_COMMUNITY): Payer: Medicare Other | Admitting: Physician Assistant

## 2020-01-25 ENCOUNTER — Encounter (HOSPITAL_COMMUNITY): Payer: Self-pay | Admitting: Orthopedic Surgery

## 2020-01-25 ENCOUNTER — Encounter (HOSPITAL_COMMUNITY)
Admission: RE | Disposition: A | Discharge status: Home / Self Care | Payer: Self-pay | Source: Other Acute Inpatient Hospital | Attending: Orthopedic Surgery

## 2020-01-25 ENCOUNTER — Ambulatory Visit (HOSPITAL_COMMUNITY)
Admission: RE | Admit: 2020-01-25 | Discharge: 2020-01-25 | Disposition: A | Discharge status: Home / Self Care | Payer: Medicare Other | Source: Other Acute Inpatient Hospital | Attending: Orthopedic Surgery | Admitting: Orthopedic Surgery

## 2020-01-25 ENCOUNTER — Ambulatory Visit (HOSPITAL_COMMUNITY): Payer: Medicare Other | Admitting: Anesthesiology

## 2020-01-25 DIAGNOSIS — I10 Essential (primary) hypertension: Secondary | ICD-10-CM | POA: Insufficient documentation

## 2020-01-25 DIAGNOSIS — Z791 Long term (current) use of non-steroidal anti-inflammatories (NSAID): Secondary | ICD-10-CM | POA: Insufficient documentation

## 2020-01-25 DIAGNOSIS — Z20822 Contact with and (suspected) exposure to covid-19: Secondary | ICD-10-CM | POA: Insufficient documentation

## 2020-01-25 DIAGNOSIS — Z79899 Other long term (current) drug therapy: Secondary | ICD-10-CM | POA: Insufficient documentation

## 2020-01-25 DIAGNOSIS — Z888 Allergy status to other drugs, medicaments and biological substances status: Secondary | ICD-10-CM | POA: Insufficient documentation

## 2020-01-25 DIAGNOSIS — M19011 Primary osteoarthritis, right shoulder: Secondary | ICD-10-CM | POA: Insufficient documentation

## 2020-01-25 DIAGNOSIS — M75101 Unspecified rotator cuff tear or rupture of right shoulder, not specified as traumatic: Secondary | ICD-10-CM | POA: Insufficient documentation

## 2020-01-25 DIAGNOSIS — G629 Polyneuropathy, unspecified: Secondary | ICD-10-CM | POA: Insufficient documentation

## 2020-01-25 DIAGNOSIS — Z87891 Personal history of nicotine dependence: Secondary | ICD-10-CM | POA: Diagnosis not present

## 2020-01-25 DIAGNOSIS — Z881 Allergy status to other antibiotic agents status: Secondary | ICD-10-CM | POA: Diagnosis not present

## 2020-01-25 HISTORY — PX: REVERSE SHOULDER ARTHROPLASTY: SHX5054

## 2020-01-25 LAB — RESPIRATORY PANEL BY RT PCR (FLU A&B, COVID)
Influenza A by PCR: NEGATIVE
Influenza B by PCR: NEGATIVE
SARS Coronavirus 2 by RT PCR: NEGATIVE

## 2020-01-25 SURGERY — ARTHROPLASTY, SHOULDER, TOTAL, REVERSE
Anesthesia: General | Site: Shoulder | Laterality: Right

## 2020-01-25 MED ORDER — ACETAMINOPHEN 160 MG/5ML PO SOLN: 325.0000 mg | ORAL | Status: DC | PRN

## 2020-01-25 MED ORDER — PROPOFOL 10 MG/ML IV BOLUS
INTRAVENOUS | Status: AC
  Filled 2020-01-25: qty 20

## 2020-01-25 MED ORDER — ROCURONIUM BROMIDE 10 MG/ML (PF) SYRINGE
PREFILLED_SYRINGE | INTRAVENOUS | Status: AC
  Filled 2020-01-25: qty 10

## 2020-01-25 MED ORDER — ACETAMINOPHEN 325 MG PO TABS: 325.0000 mg | ORAL_TABLET | ORAL | Status: DC | PRN

## 2020-01-25 MED ORDER — FENTANYL CITRATE (PF) 250 MCG/5ML IJ SOLN
INTRAMUSCULAR | Status: AC
  Filled 2020-01-25: qty 5

## 2020-01-25 MED ORDER — BUPIVACAINE-EPINEPHRINE (PF) 0.25% -1:200000 IJ SOLN
INTRAMUSCULAR | Status: AC
  Filled 2020-01-25: qty 30

## 2020-01-25 MED ORDER — DEXAMETHASONE SODIUM PHOSPHATE 10 MG/ML IJ SOLN
INTRAMUSCULAR | Status: AC
  Filled 2020-01-25: qty 1

## 2020-01-25 MED ORDER — BUPIVACAINE LIPOSOME 1.3 % IJ SUSP
INTRAMUSCULAR | Status: DC | PRN
  Administered 2020-01-25 (×5): 2 mL via PERINEURAL

## 2020-01-25 MED ORDER — METHOCARBAMOL 500 MG PO TABS: 500.0000 mg | ORAL_TABLET | Freq: Three times a day (TID) | ORAL | 1 refills | Status: DC | PRN

## 2020-01-25 MED ORDER — SUCCINYLCHOLINE CHLORIDE 200 MG/10ML IV SOSY
PREFILLED_SYRINGE | INTRAVENOUS | Status: DC | PRN
  Administered 2020-01-25: 120 mg via INTRAVENOUS

## 2020-01-25 MED ORDER — PROPOFOL 10 MG/ML IV BOLUS
INTRAVENOUS | Status: DC | PRN
  Administered 2020-01-25 (×3): 50 mg via INTRAVENOUS
  Administered 2020-01-25: 150 mg via INTRAVENOUS

## 2020-01-25 MED ORDER — LACTATED RINGERS IV SOLN: INTRAVENOUS | Status: DC

## 2020-01-25 MED ORDER — 0.9 % SODIUM CHLORIDE (POUR BTL) OPTIME
TOPICAL | Status: DC | PRN
  Administered 2020-01-25: 1000 mL

## 2020-01-25 MED ORDER — DEXAMETHASONE SODIUM PHOSPHATE 10 MG/ML IJ SOLN
INTRAMUSCULAR | Status: DC | PRN
  Administered 2020-01-25: 4 mg via INTRAVENOUS

## 2020-01-25 MED ORDER — BUPIVACAINE-EPINEPHRINE (PF) 0.25% -1:200000 IJ SOLN
INTRAMUSCULAR | Status: DC | PRN
  Administered 2020-01-25: 12 mL

## 2020-01-25 MED ORDER — PHENYLEPHRINE 40 MCG/ML (10ML) SYRINGE FOR IV PUSH (FOR BLOOD PRESSURE SUPPORT)
PREFILLED_SYRINGE | INTRAVENOUS | Status: DC | PRN
  Administered 2020-01-25: 120 ug via INTRAVENOUS
  Administered 2020-01-25: 80 ug via INTRAVENOUS

## 2020-01-25 MED ORDER — ROCURONIUM BROMIDE 10 MG/ML (PF) SYRINGE
PREFILLED_SYRINGE | INTRAVENOUS | Status: DC | PRN
  Administered 2020-01-25: 10 mg via INTRAVENOUS

## 2020-01-25 MED ORDER — EPHEDRINE SULFATE-NACL 50-0.9 MG/10ML-% IV SOSY
PREFILLED_SYRINGE | INTRAVENOUS | Status: DC | PRN
  Administered 2020-01-25 (×3): 5 mg via INTRAVENOUS

## 2020-01-25 MED ORDER — ORAL CARE MOUTH RINSE: 15.0000 mL | Freq: Once | OROMUCOSAL | Status: AC

## 2020-01-25 MED ORDER — CHLORHEXIDINE GLUCONATE 0.12 % MT SOLN
15.0000 mL | Freq: Once | OROMUCOSAL | Status: AC
  Administered 2020-01-25: 15 mL via OROMUCOSAL

## 2020-01-25 MED ORDER — ONDANSETRON HCL 4 MG PO TABS: 4.0000 mg | ORAL_TABLET | Freq: Three times a day (TID) | ORAL | 1 refills | Status: AC | PRN

## 2020-01-25 MED ORDER — FENTANYL CITRATE (PF) 250 MCG/5ML IJ SOLN
INTRAMUSCULAR | Status: DC | PRN
Start: 2020-01-25 — End: 2020-01-25
  Administered 2020-01-25: 50 ug via INTRAVENOUS
  Administered 2020-01-25: 25 ug via INTRAVENOUS
  Administered 2020-01-25: 50 ug via INTRAVENOUS
  Administered 2020-01-25: 25 ug via INTRAVENOUS

## 2020-01-25 MED ORDER — OXYCODONE-ACETAMINOPHEN 5-325 MG PO TABS: 1.0000 | ORAL_TABLET | ORAL | 0 refills | Status: AC | PRN

## 2020-01-25 MED ORDER — LIDOCAINE 2% (20 MG/ML) 5 ML SYRINGE
INTRAMUSCULAR | Status: DC | PRN
  Administered 2020-01-25: 100 mg via INTRAVENOUS

## 2020-01-25 MED ORDER — LIDOCAINE 2% (20 MG/ML) 5 ML SYRINGE
INTRAMUSCULAR | Status: AC
  Filled 2020-01-25: qty 5

## 2020-01-25 MED ORDER — BUPIVACAINE-EPINEPHRINE (PF) 0.5% -1:200000 IJ SOLN
INTRAMUSCULAR | Status: DC | PRN
  Administered 2020-01-25 (×5): 3 mL via PERINEURAL

## 2020-01-25 MED ORDER — OXYCODONE HCL 5 MG/5ML PO SOLN: 5.0000 mg | Freq: Once | ORAL | Status: DC | PRN

## 2020-01-25 MED ORDER — FENTANYL CITRATE (PF) 100 MCG/2ML IJ SOLN: 25.0000 ug | INTRAMUSCULAR | Status: DC | PRN

## 2020-01-25 MED ORDER — ONDANSETRON HCL 4 MG/2ML IJ SOLN: 4.0000 mg | Freq: Once | INTRAMUSCULAR | Status: DC | PRN

## 2020-01-25 MED ORDER — MIDAZOLAM HCL 2 MG/2ML IJ SOLN
INTRAMUSCULAR | Status: AC
  Filled 2020-01-25: qty 2

## 2020-01-25 MED ORDER — PHENYLEPHRINE HCL-NACL 10-0.9 MG/250ML-% IV SOLN
INTRAVENOUS | Status: DC | PRN
  Administered 2020-01-25: 40 ug/min via INTRAVENOUS

## 2020-01-25 MED ORDER — ONDANSETRON HCL 4 MG/2ML IJ SOLN
INTRAMUSCULAR | Status: DC | PRN
  Administered 2020-01-25: 4 mg via INTRAVENOUS

## 2020-01-25 MED ORDER — ONDANSETRON HCL 4 MG/2ML IJ SOLN
INTRAMUSCULAR | Status: AC
  Filled 2020-01-25: qty 2

## 2020-01-25 MED ORDER — ACETAMINOPHEN 10 MG/ML IV SOLN: 1000.0000 mg | Freq: Once | INTRAVENOUS | Status: DC | PRN

## 2020-01-25 MED ORDER — OXYCODONE HCL 5 MG PO TABS: 5.0000 mg | ORAL_TABLET | Freq: Once | ORAL | Status: DC | PRN

## 2020-01-25 MED ORDER — CEFAZOLIN SODIUM-DEXTROSE 2-4 GM/100ML-% IV SOLN
2.0000 g | INTRAVENOUS | Status: AC
  Administered 2020-01-25: 2 g via INTRAVENOUS
  Filled 2020-01-25: qty 100

## 2020-01-25 MED ORDER — STERILE WATER FOR IRRIGATION IR SOLN
Status: DC | PRN
  Administered 2020-01-25: 2000 mL

## 2020-01-25 SURGICAL SUPPLY — 77 items
AID PSTN UNV HD RSTRNT DISP (MISCELLANEOUS) ×1
BAG SPEC THK2 15X12 ZIP CLS (MISCELLANEOUS)
BAG ZIPLOCK 12X15 (MISCELLANEOUS) IMPLANT
BIT DRILL 1.6MX128 (BIT) IMPLANT
BIT DRILL 1.6MX128MM (BIT)
BIT DRILL 170X2.5X (BIT) IMPLANT
BIT DRL 170X2.5X (BIT) ×1
BLADE SAG 18X100X1.27 (BLADE) ×3 IMPLANT
CLOSURE WOUND 1/2 X4 (GAUZE/BANDAGES/DRESSINGS) ×1
COVER BACK TABLE 60X90IN (DRAPES) ×3 IMPLANT
COVER SURGICAL LIGHT HANDLE (MISCELLANEOUS) ×3 IMPLANT
COVER WAND RF STERILE (DRAPES) IMPLANT
CUP HUMERAL 42 PLUS 3 (Orthopedic Implant) ×2 IMPLANT
DECANTER SPIKE VIAL GLASS SM (MISCELLANEOUS) ×3 IMPLANT
DRAPE INCISE IOBAN 66X45 STRL (DRAPES) ×3 IMPLANT
DRAPE ORTHO SPLIT 77X108 STRL (DRAPES) ×6
DRAPE POUCH INSTRU U-SHP 10X18 (DRAPES) ×2 IMPLANT
DRAPE SHEET LG 3/4 BI-LAMINATE (DRAPES) ×3 IMPLANT
DRAPE SURG ORHT 6 SPLT 77X108 (DRAPES) ×2 IMPLANT
DRAPE TOP 10253 STERILE (DRAPES) ×3 IMPLANT
DRAPE U-SHAPE 47X51 STRL (DRAPES) ×3 IMPLANT
DRILL 2.5 (BIT) ×3
DRSG ADAPTIC 3X8 NADH LF (GAUZE/BANDAGES/DRESSINGS) ×3 IMPLANT
DRSG PAD ABDOMINAL 8X10 ST (GAUZE/BANDAGES/DRESSINGS) ×3 IMPLANT
DURAPREP 26ML APPLICATOR (WOUND CARE) ×3 IMPLANT
ELECT BLADE TIP CTD 4 INCH (ELECTRODE) ×3 IMPLANT
ELECT NDL TIP 2.8 STRL (NEEDLE) ×1 IMPLANT
ELECT NEEDLE TIP 2.8 STRL (NEEDLE) ×3 IMPLANT
ELECT REM PT RETURN 15FT ADLT (MISCELLANEOUS) ×3 IMPLANT
EPIPHYSI RIGHT SZ 2 (Shoulder) ×3 IMPLANT
EPIPHYSIS RIGHT SZ 2 (Shoulder) IMPLANT
FACESHIELD WRAPAROUND (MASK) ×6 IMPLANT
FACESHIELD WRAPAROUND OR TEAM (MASK) IMPLANT
GAUZE SPONGE 4X4 12PLY STRL (GAUZE/BANDAGES/DRESSINGS) ×3 IMPLANT
GLENOSPHERE XTEND LAT 42+0 STD (Miscellaneous) ×2 IMPLANT
GLOVE BIOGEL PI ORTHO PRO 7.5 (GLOVE) ×2
GLOVE BIOGEL PI ORTHO PRO SZ8 (GLOVE) ×2
GLOVE ORTHO TXT STRL SZ7.5 (GLOVE) ×3 IMPLANT
GLOVE PI ORTHO PRO STRL 7.5 (GLOVE) ×1 IMPLANT
GLOVE PI ORTHO PRO STRL SZ8 (GLOVE) ×1 IMPLANT
GLOVE SURG ORTHO 8.5 STRL (GLOVE) ×3 IMPLANT
GOWN STRL REUS W/TWL XL LVL3 (GOWN DISPOSABLE) ×6 IMPLANT
KIT BASIN OR (CUSTOM PROCEDURE TRAY) ×3 IMPLANT
KIT TURNOVER KIT A (KITS) IMPLANT
MANIFOLD NEPTUNE II (INSTRUMENTS) ×3 IMPLANT
METAGLENE DELTA EXTEND (Trauma) IMPLANT
METAGLENE DXTEND (Trauma) ×3 IMPLANT
NDL MAYO CATGUT SZ4 TPR NDL (NEEDLE) IMPLANT
NEEDLE MAYO CATGUT SZ4 (NEEDLE) IMPLANT
NS IRRIG 1000ML POUR BTL (IV SOLUTION) ×3 IMPLANT
PACK SHOULDER (CUSTOM PROCEDURE TRAY) ×3 IMPLANT
PENCIL SMOKE EVACUATOR (MISCELLANEOUS) IMPLANT
PIN GUIDE 1.2 (PIN) ×2 IMPLANT
PIN GUIDE GLENOPHERE 1.5MX300M (PIN) ×2 IMPLANT
PIN METAGLENE 2.5 (PIN) ×2 IMPLANT
PROTECTOR NERVE ULNAR (MISCELLANEOUS) ×3 IMPLANT
RESTRAINT HEAD UNIVERSAL NS (MISCELLANEOUS) ×3 IMPLANT
SCREW 4.5X36MM (Screw) ×2 IMPLANT
SCREW 48L (Screw) ×2 IMPLANT
SCREW LOCK 42 (Screw) ×2 IMPLANT
SLING ARM FOAM STRAP LRG (SOFTGOODS) IMPLANT
SMARTMIX MINI TOWER (MISCELLANEOUS)
SPONGE LAP 4X18 RFD (DISPOSABLE) IMPLANT
STEM 12 HA (Stem) ×2 IMPLANT
STRIP CLOSURE SKIN 1/2X4 (GAUZE/BANDAGES/DRESSINGS) ×2 IMPLANT
SUCTION FRAZIER HANDLE 10FR (MISCELLANEOUS) ×3
SUCTION TUBE FRAZIER 10FR DISP (MISCELLANEOUS) ×1 IMPLANT
SUT FIBERWIRE #2 38 T-5 BLUE (SUTURE) ×6
SUT MNCRL AB 4-0 PS2 18 (SUTURE) ×3 IMPLANT
SUT VIC AB 0 CT1 36 (SUTURE) ×6 IMPLANT
SUT VIC AB 0 CT2 27 (SUTURE) ×3 IMPLANT
SUT VIC AB 2-0 CT1 27 (SUTURE) ×3
SUT VIC AB 2-0 CT1 TAPERPNT 27 (SUTURE) ×1 IMPLANT
SUTURE FIBERWR #2 38 T-5 BLUE (SUTURE) ×2 IMPLANT
SYR BULB IRRIG 60ML STRL (SYRINGE) ×2 IMPLANT
TOWEL OR 17X26 10 PK STRL BLUE (TOWEL DISPOSABLE) ×3 IMPLANT
TOWER SMARTMIX MINI (MISCELLANEOUS) IMPLANT

## 2020-01-25 NOTE — Interval H&P Note (Signed)
History and Physical Interval Note:  01/25/2020 7:32 AM  [name]  has presented today for surgery, with the diagnosis of Right shoulder cuff arthropathy.  The various methods of treatment have been discussed with the patient and family. After consideration of risks, benefits and other options for treatment, the patient has consented to  Procedure(s): REVERSE SHOULDER ARTHROPLASTY (Right) as a surgical intervention.  The patient's history has been reviewed, patient examined, no change in status, stable for surgery.  I have reviewed the patient's chart and labs.  Questions were answered to the patient's satisfaction.     [name]

## 2020-01-25 NOTE — Progress Notes (Signed)
Occupational Therapy Evaluation  s/p shoulder replacement without functional use of right dominant upper extremity secondary to effects of surgery and interscalene block and shoulder precautions. Therapist provided education and instruction to patient and brother in regards to exercises, precautions, positioning, donning upper extremity clothing and bathing while maintaining shoulder precautions, ice and edema management and donning/doffing sling. Patient and brother verbalized understanding and demonstrated as needed. Patient needed assistance to donn shirt, underwear, pants, socks and shoes and provided with instruction on compensatory strategies to perform ADLs. Patient to follow up with MD for further therapy needs.     01/25/20 1236  OT Visit Information  Last OT Received On 01/25/20  Assistance Needed +1  History of Present Illness Patient is an 84 year old male admitted 10/1 for R reverse total shoulder replacement. PMH includes Peripheral polyneuropathy, rotator cuff repair, cervical fusion  Precautions  Precautions Shoulder  Type of Shoulder Precautions ok pendulums, lap slides, active shoulder elbow wrist ROM as long as it stays in front of body, okay use arm for balance, ok hand to face   Shoulder Interventions Shoulder sling/immobilizer;For comfort (use while + nerve block)  Precaution Booklet Issued Yes (comment)  Required Braces or Orthoses Sling  Restrictions  Weight Bearing Restrictions Yes  RUE Weight Bearing NWB  Other Position/Activity Restrictions okay light weight bearing R UE for balance only  Home Living  Family/patient expects to be discharged to: Private residence  Living Arrangements Alone  Available Help at Discharge Personal care attendant  Type of Home House  Home Access Stairs to enter  Entrance Stairs-Number of Steps 2  Home Layout One level  Bathroom Shower/Tub Walk-in shower;Tub/shower unit  [employer] Handicapped [employer] -  single point;[place] - 2 wheels;Grab bars - tub/shower  Additional Comments patient hired a caregiver to be with him after surgery  Prior Function  Level of Independence Independent with assistive device(s)  Comments SPC  Communication  Communication HOH  Pain Assessment  Pain Assessment Faces  Faces Pain Scale 4  Pain Location R shoulder  Pain Descriptors / Indicators Grimacing  Pain Intervention(s) Monitored during session  Cognition  Arousal/Alertness Awake/alert  Behavior During Therapy WFL for tasks assessed/performed  Overall Cognitive Status Within Functional Limits for tasks assessed  Upper Extremity Assessment  Upper Extremity Assessment RUE deficits/detail  RUE Deficits / Details + nerve block, digit ROM intact   Lower Extremity Assessment  Lower Extremity Assessment Overall WFL for tasks assessed  Cervical / Trunk Assessment  Cervical / Trunk Assessment Kyphotic  ADL  Overall ADL's  Needs assistance/impaired  Upper Body Bathing Minimal assistance;Sitting  Lower Body Bathing Minimal assistance;Sit to/from stand  Upper Body Dressing  Minimal assistance;Sitting;Cueing for UE precautions;Cueing for compensatory techniques  Upper Body Dressing Details (indicate cue type and reason) assist to thread R UE due to nerve block, educate on technique  Lower Body Dressing Minimal assistance;Sit to/from stand;Sitting/lateral leans  Lower Body Dressing Details (indicate cue type and reason) patient able to thread, min A due to sticking to socks and min A in standing for balance due to posterior lean  Toilet Transfer Min guard;Ambulation [facility])  Toilet Transfer Details (indicate cue type and reason) simulated with functional ambulation in room, min G for safety and cues for pacing  Toileting- Clothing Manipulation and Hygiene Min guard;Sitting/lateral lean;Sit to/from stand  Functional mobility during ADLs Min guard;Cane  General ADL Comments patient educated in shoulder precautions and  how to maintain during self care with compensatory strategies  Bed Mobility  General bed mobility comments in chair  Transfers  Overall transfer level Needs assistance  Equipment used Straight cane  Transfers Sit to/from Stand  Sit to Stand Min guard  General transfer comment for steadying assist  Balance  Overall balance assessment Mild deficits observed, not formally tested  Exercises  Exercises Shoulder  Shoulder Instructions  Donning/doffing shirt without moving shoulder Minimal assistance;Patient able to independently direct caregiver  Method for sponge bathing under operated UE Minimal assistance;Patient able to independently direct caregiver  Donning/doffing sling/immobilizer Moderate assistance;Patient able to independently direct caregiver  Correct positioning of sling/immobilizer Independent;Patient able to independently direct caregiver  Pendulum exercises (written home exercise program) Patient able to independently direct caregiver  ROM for elbow, wrist and digits of operated UE Patient able to independently direct caregiver  Sling wearing schedule (on at all times/off for ADL's) Independent;Patient able to independently direct caregiver  Proper positioning of operated UE when showering Patient able to independently direct caregiver  Positioning of UE while sleeping Patient able to independently direct caregiver  OT - End of Session  Equipment Utilized During Treatment Other (comment) (sling)  Activity Tolerance Patient tolerated treatment well  Patient left in chair;with family/visitor present  Nurse Communication Mobility status  OT Assessment  OT Recommendation/Assessment Progress rehab of shoulder as ordered by MD at follow-up appointment  OT Visit Diagnosis Pain  Pain - Right/Left Right  Pain - part of body Shoulder  OT Problem List Pain;Impaired UE functional use  AM-PAC OT "6 Clicks" Daily Activity Outcome Measure (Version 2)  Help from another person eating  meals? 4  Help from another person taking care of personal grooming? 3  Help from another person toileting, which includes using toliet, bedpan, or urinal? 3  Help from another person bathing (including washing, rinsing, drying)? 3  Help from another person to put on and taking off regular upper body clothing? 3  Help from another person to put on and taking off regular lower body clothing? 3  6 Click Score 19  OT Recommendation  Follow Up Recommendations Follow surgeon's recommendation for DC plan and follow-up therapies  OT Equipment Tub/shower seat  OT Time Calculation  OT Start Time (ACUTE ONLY) 1149  OT Stop Time (ACUTE ONLY) 1222  OT Time Calculation (min) 33 min  OT General Charges  $OT Visit 1 Visit  OT Evaluation  $OT Eval Low Complexity 1 Low  OT Treatments  $Self Care/Home Management  8-22 mins  Written Expression  Dominant Hand Right   Marlyce Huge OT OT pager: [phone]

## 2020-01-25 NOTE — Anesthesia Procedure Notes (Signed)
Procedure Name: Intubation Performed by: Kazaria Gaertner H, CRNA Pre-anesthesia Checklist: Patient identified, Emergency Drugs available, Suction available and Patient being monitored Patient Re-evaluated:Patient Re-evaluated prior to induction Oxygen Delivery Method: Circle System Utilized Preoxygenation: Pre-oxygenation with 100% oxygen Induction Type: IV induction Ventilation: Mask ventilation without difficulty Laryngoscope Size: Mac and 4 Grade View: Grade I Tube type: Oral Tube size: 7.5 mm Number of attempts: 1 Airway Equipment and Method: Stylet Placement Confirmation: ETT inserted through vocal cords under direct vision,  positive ETCO2 and breath sounds checked- equal and bilateral Secured at: 23 cm Tube secured with: Tape Dental Injury: Teeth and Oropharynx as per pre-operative assessment        

## 2020-01-25 NOTE — Anesthesia Postprocedure Evaluation (Signed)
Anesthesia Post Note  Patient: [name]  Procedure(s) Performed: REVERSE SHOULDER ARTHROPLASTY (Right Shoulder)     Patient location during evaluation: PACU Anesthesia Type: General Level of consciousness: awake Pain management: pain level controlled Vital Signs Assessment: post-procedure vital signs reviewed and stable Respiratory status: spontaneous breathing Cardiovascular status: stable Postop Assessment: no apparent nausea or vomiting Anesthetic complications: no   No complications documented.  Last Vitals:  Vitals:   01/25/20 1017 01/25/20 1030  BP:  102/89  Pulse: 86 90  Resp: 16 16  Temp:    SpO2: 96% 93%    Last Pain:  Vitals:   01/25/20 1030  TempSrc:   PainSc: 0-No pain                 Caren Macadam

## 2020-01-25 NOTE — Brief Op Note (Signed)
01/25/2020  9:48 AM  PATIENT:  [name]  84 y.o. male  PRE-OPERATIVE DIAGNOSIS:  Right shoulder cuff arthropathy  POST-OPERATIVE DIAGNOSIS:  Right shoulder cuff arthropathy  PROCEDURE:  Procedure(s): REVERSE SHOULDER ARTHROPLASTY (Right)  Depuy Delta Xtend  SURGEON:  Surgeon(s) and Role:    Beverely Low, MD - Primary  PHYSICIAN ASSISTANT:   ASSISTANTS: Thea Gist, PA-C   ANESTHESIA:   regional and general  EBL:  250 mL   BLOOD ADMINISTERED:none  DRAINS: none   LOCAL MEDICATIONS USED:  MARCAINE     SPECIMEN:  No Specimen  DISPOSITION OF SPECIMEN:  N/A  COUNTS:  YES  TOURNIQUET:  * No tourniquets in log *  DICTATION: .Other Dictation: Dictation Number [id number]  PLAN OF CARE: Discharge to home after PACU  PATIENT DISPOSITION:  PACU - hemodynamically stable.   Delay start of Pharmacological VTE agent (>24hrs) due to surgical blood loss or risk of bleeding: not applicable

## 2020-01-25 NOTE — Transfer of Care (Signed)
Immediate Anesthesia Transfer of Care Note  Patient: [name]  Procedure(s) Performed: REVERSE SHOULDER ARTHROPLASTY (Right Shoulder)  Patient Location: PACU  Anesthesia Type:General  Level of Consciousness: awake  Airway & Oxygen Therapy: Patient Spontanous Breathing and Patient connected to nasal cannula oxygen  Post-op Assessment: Report given to RN and Post -op Vital signs reviewed and stable  Post vital signs: Reviewed and stable  Last Vitals:  Vitals Value Taken Time  BP 117/71 01/25/20 0939  Temp    Pulse 79 01/25/20 0942  Resp 15 01/25/20 0942  SpO2 96 % 01/25/20 0942  Vitals shown include unvalidated device data.  Last Pain:  Vitals:   01/25/20 0610  TempSrc: Oral  PainSc:          Complications: No complications documented.

## 2020-01-25 NOTE — Op Note (Signed)
NAME: [name], [name] MEDICAL RECORD [id number] ACCOUNT [id number] DATE OF BIRTH:01-03-1936 FACILITY: [facility] LOCATION: [facility] PHYSICIAN:STEVEN Russ Halo, MD  OPERATIVE REPORT  DATE OF PROCEDURE:  01/25/2020  PREOPERATIVE DIAGNOSIS:  Right shoulder rotator cuff tear arthropathy.  POSTOPERATIVE DIAGNOSIS:  Right shoulder rotator cuff tear arthropathy.  PROCEDURE PERFORMED:  Right reverse total shoulder replacement using DePuy Delta Xtend prosthesis with no subscapularis repair.  ATTENDING SURGEON:  Malon Kindle, MD  ASSISTANT:  Modesto Charon, [place], who was scrubbed during the entire procedure and necessary for satisfactory completion of surgery.  ANESTHESIA:  General anesthesia was used plus interscalene block.  ESTIMATED BLOOD LOSS:  Minimal.  FLUID REPLACEMENT:  1200 mL crystalloid.  INSTRUMENT COUNTS:  Correct.  COMPLICATIONS:  No complications.  ANTIBIOTICS:  Perioperative antibiotics were given.  INDICATIONS:  The patient is an 84 year old male with a history of worsening right shoulder pain and dysfunction secondary to end-stage arthritis and rotator cuff tear arthropathy.  The patient presents for operative treatment to restore function and  eliminate pain.  Informed consent obtained.  DESCRIPTION OF PROCEDURE:  After an adequate level of anesthesia was achieved, the patient was positioned in modified beach chair position.  Right shoulder correctly identified and sterilely prepped and draped in the usual manner.  Time-out called,  verifying correct patient, correct site.  After sterile prep and drape of the shoulder and arm and verification of timeout, we performed a deltopectoral incision, starting at the coracoid process extending down to the anterior humerus with a 10 blade  scalpel.  Dissection down through subcutaneous tissues using Bovie electrocautery.  We identified cephalic vein, took that laterally with the deltoid, pectoralis taken medially.  Conjoined  tendon identified and retracted medially.  Biceps tendon was  identified and tenodesed in situ with figure-of-eight suture, incorporating pec tendon.  This was a soft tissue tenodesis.  We then went ahead and released the subscapularis off the lesser tuberosity.  This was a remnant only and it was not repairable.   We tagged it for retraction and protection of the axillary nerve.  We then released the inferior capsule, progressively externally rotating.  We then extended the shoulder and deliver the humeral head out of the wound.  This was devoid of any rotator  cuff back to the teres minor, which we preserved.  We then entered the proximal humerus with a 6 mm entry reamer, reaming up to a size 12.  We then placed our 12 mm intramedullary resection guide and resected the head at 10 degrees of retroversion with  an oscillating saw.  We then removed excess osteophytes with a rongeur.  We then reduced the shoulder back into the wound and subluxed that posteriorly.  We then placed our deep retractors, getting good exposure of the glenoid face.  Once we had our  exposure, we went ahead and removed the remaining cartilage on the glenoid.  We then placed our central guide pin and reamed for the metaglene baseplate.  We did our peripheral hand reaming and then drilled our central peg hole.  We impacted the  metaglene baseplate into position referencing off the 6 o'clock position on the scapular neck.  We then placed our 48 locked screw inferiorly, a 30 at the base of coracoid and a 36 anteriorly.  We had good baseplate security.  It was a little off the  bone in the back and superiorly, but overall we had about 75% bone to implant apposition.  We felt like this was stable.  We  placed a 42 standard glenosphere into position and screwed that onto the baseplate.  We then irrigated thoroughly.  We went back  to the humeral side and completed our humeral preparation with the modular metaphyseal reamer, reaming for the 2  right metaphysis.  We then used a 12 stem 2 right set on the 0 setting and placed in 10 degrees of retroversion and impacted that in  position.  We then reduced the shoulder with a 42+3 poly.  We were happy with that soft tissue balancing.  Everything moved together as a unit.  No gapping with inferior pole and the conjoined appropriately tensioned.  We removed all trial components  from the humeral side, irrigating thoroughly.  We then used available bone graft from the head in impaction grafting technique with the HA coated press-fit stem 12 with the 2 right set on the 0 setting, impacted in 10 degrees of retroversion.  We had a  nice stable stem.  We selected a 42+3 real poly, impacted that onto the tray and then reduced the shoulder.  Again nice and stable, not overly tight, but a very stable, everything moving together.  No gapping with external rotation or inferior pole and  conjoined appropriately tensioned.  We irrigated again and then resected the remnant of the subscap and then repaired deltopectoral interval with 0 Vicryl suture, followed by 2-0 Vicryl for subcutaneous closure and 4-0 Monocryl for skin.  Steri-Strips  were applied, followed by sterile dressing.  The patient tolerated surgery well.  VN/NUANCE  D:01/25/2020 T:01/25/2020 JOB:[id number]

## 2020-01-25 NOTE — Discharge Instructions (Signed)
Ice to the shoulder constantly.  Keep the incision covered and clean and dry for one week, then ok to get it wet in the shower.  Do exercise as instructed by Occupational Therapy several times per day.  DO NOT reach behind your back or push up out of a chair with the operative arm.  Use a sling while you are up and around for comfort, may remove while seated.  Keep pillow propped behind the operative elbow so that your arm is in front of you.   Follow up with Dr Ranell Patrick in two weeks in the office, call [phone] for appt

## 2020-01-25 NOTE — Anesthesia Procedure Notes (Signed)
Anesthesia Regional Block: Interscalene brachial plexus block   Pre-Anesthetic Checklist: ,, timeout performed, Correct Patient, Correct Site, Correct Laterality, Correct Procedure, Correct Position, site marked, Risks and benefits discussed,  Surgical consent,  Pre-op evaluation,  At surgeon's request and post-op pain management  Laterality: Right and Upper  Prep: chloraprep       Needles:  Injection technique: Single-shot  Needle Type: Echogenic Stimulator Needle     Needle Length: 9cm  Needle Gauge: 20   Needle insertion depth: 1 cm   Additional Needles:   Procedures:,,,, ultrasound used (permanent image in chart),,,,  Narrative:  Start time: 01/25/2020 6:55 AM End time: 01/25/2020 7:05 AM Injection made incrementally with aspirations every 5 mL.  Performed by: Personally  Anesthesiologist: Leilani Able, MD

## 2020-01-28 ENCOUNTER — Encounter (HOSPITAL_COMMUNITY): Payer: Self-pay | Admitting: Orthopedic Surgery

## 2020-10-12 IMAGING — MR MR LUMBAR SPINE W/O CM
4 of 5 series · 18 of 48 positions shown · non-contrast
Comparison: 06/14/2015

CLINICAL DATA: Bilateral lower extremity radiculopathy. Worsening
pain.

EXAM:
MRI LUMBAR SPINE WITHOUT CONTRAST
TECHNIQUE: Multiplanar, multisequence MR imaging of the lumbar spine was
performed. No intravenous contrast was administered.

[Series 6: T2 · sagittal · 4.0mm · 0.73mm/px · 6 of 15 slices shown (1 of 2)]
[im 1/15]
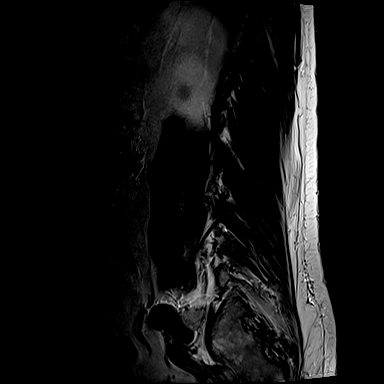
[im 3/15]
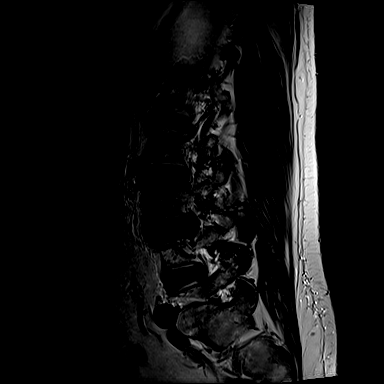
[im 6/15]
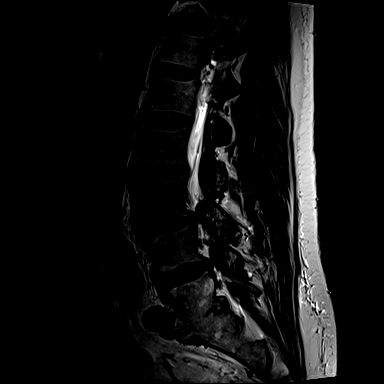
[im 9/15]
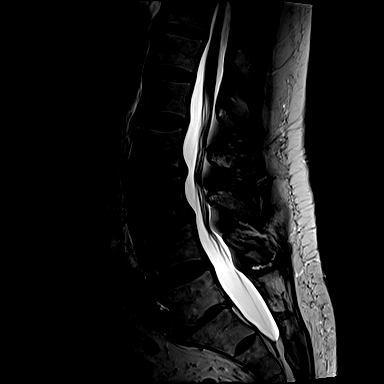
[im 12/15]
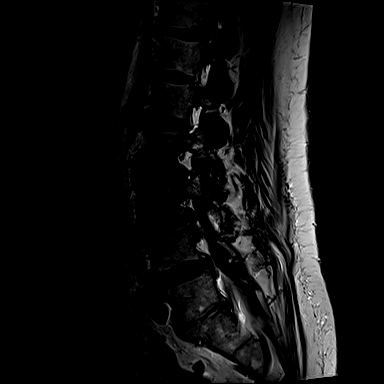
[im 15/15]
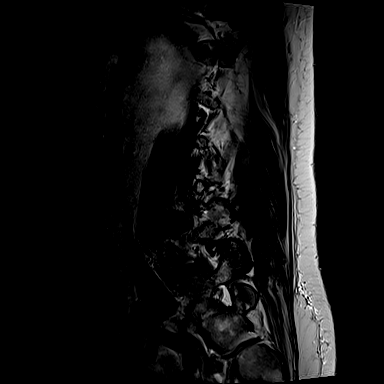

[Series 7: T1 · sagittal · 4.0mm · 0.73mm/px · 3 of 15 slices shown (1 of 2)]
[im 1/15]
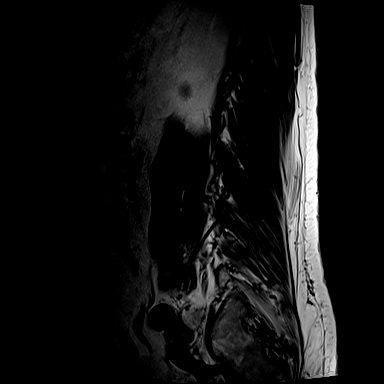
[im 8/15]
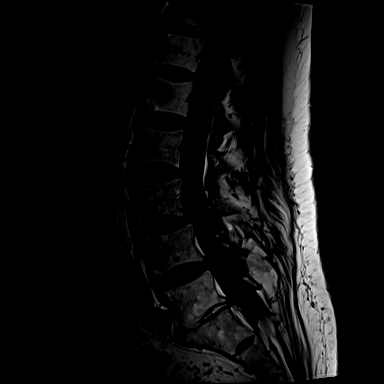
[im 15/15]
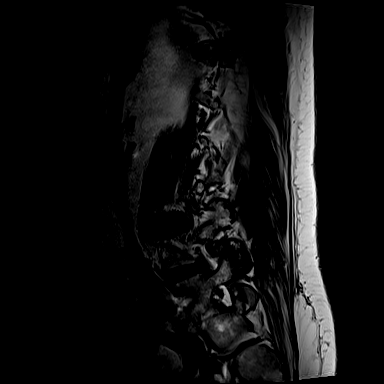

[Series 13: T2 · axial · 4.0mm · 0.28mm/px · z∈[+30,+217]mm · 6 of 43 slices shown (2 of 2)]
[im 3/43]
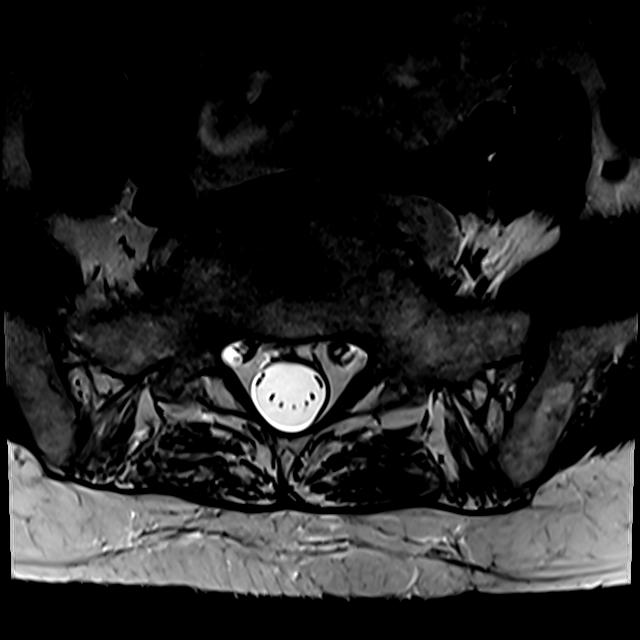
[im 6/43]
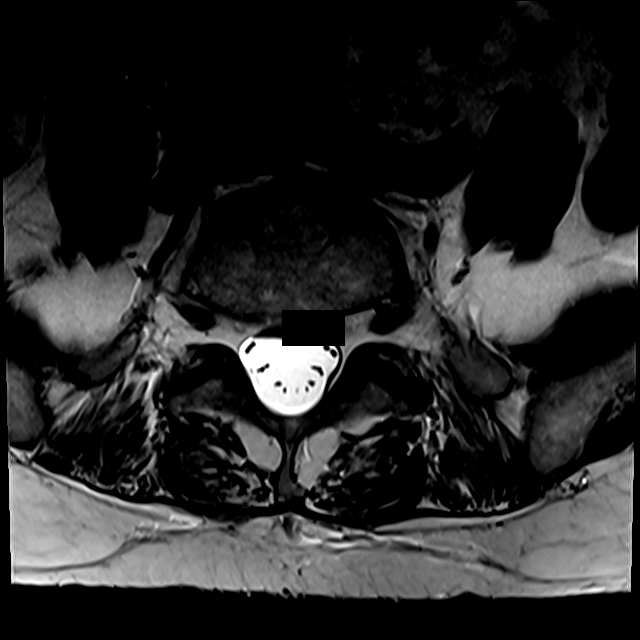
[im 9/43]
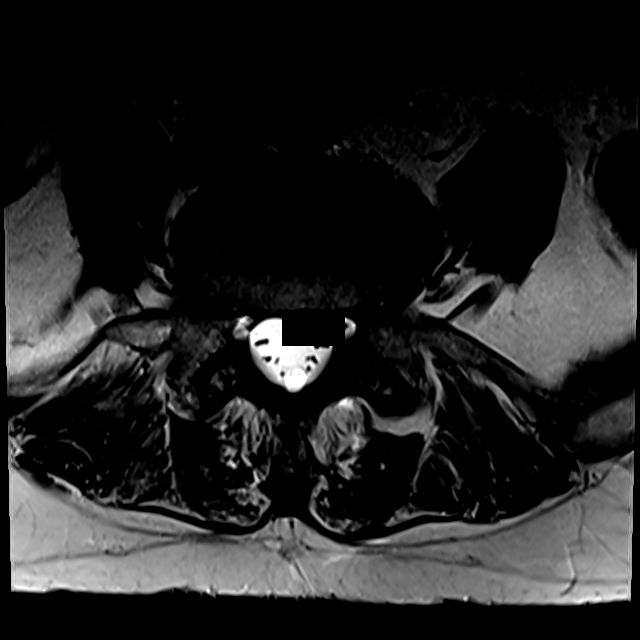
[im 15/43]
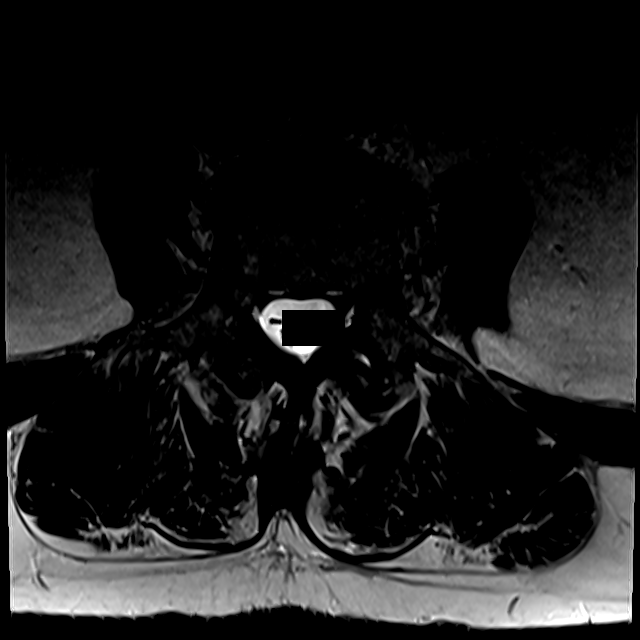
[im 23/43]
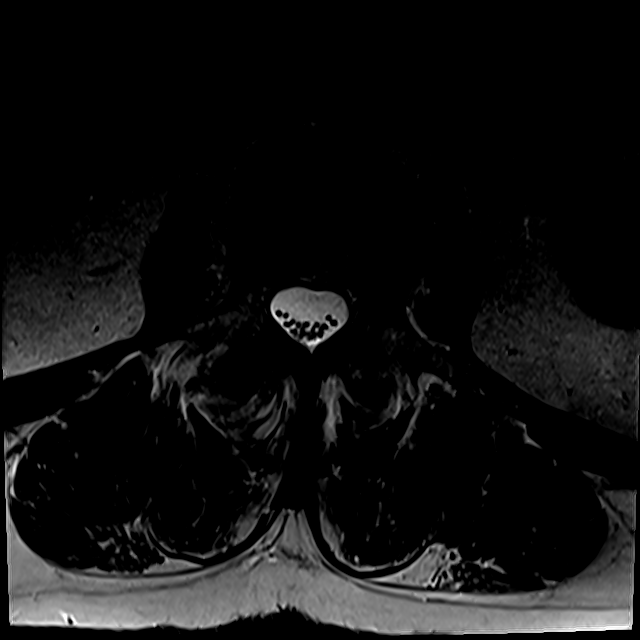
[im 37/43]
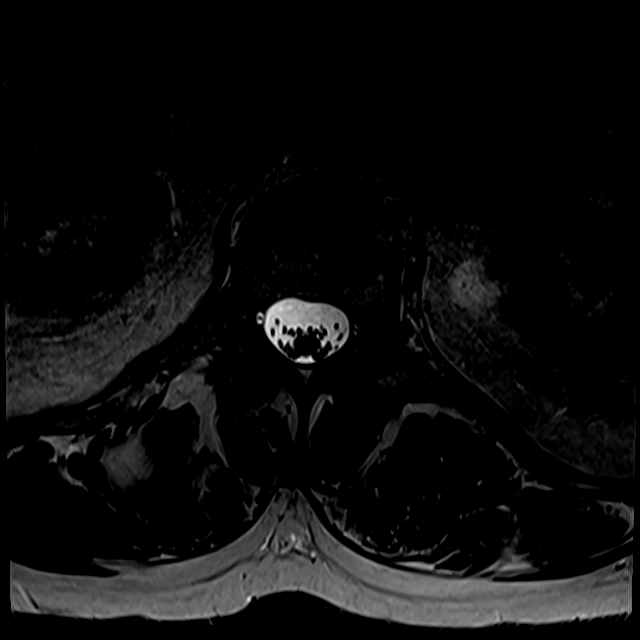

[Series 100: T1 · axial · 4.0mm · 0.28mm/px · z∈[+44,+217]mm · 3 of 43 slices shown (2 of 2)]
[im 6/43]
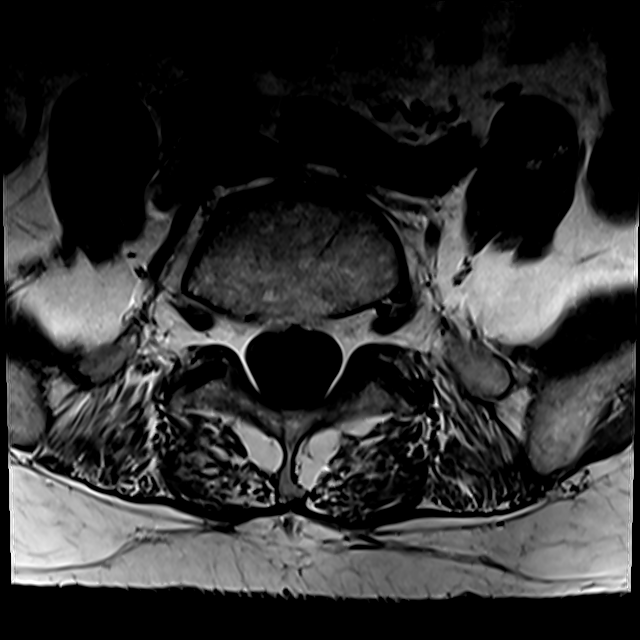
[im 23/43]
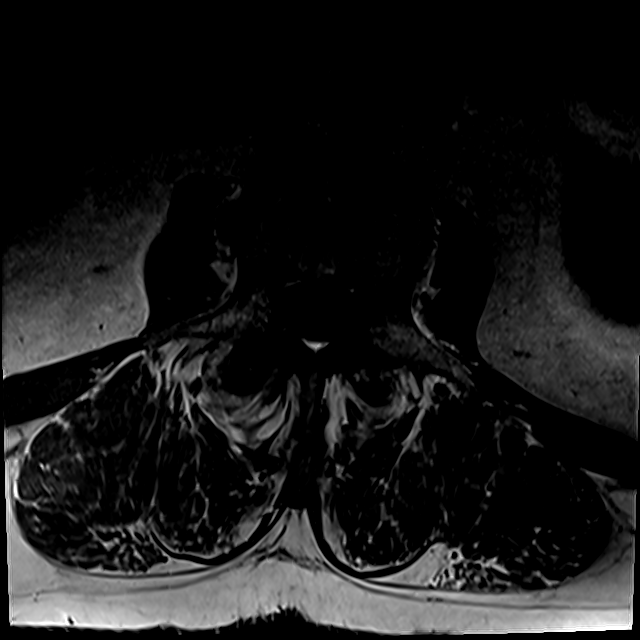
[im 37/43]
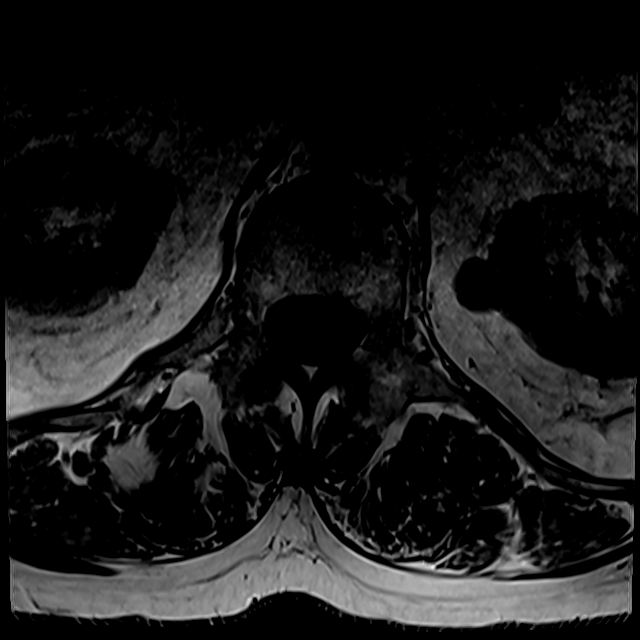

[18 of 48 positions shown; findings below may reference images not displayed]

FINDINGS: Segmentation:  Standard.

Alignment: Minimal grade 1 anterolisthesis of L3 on L4 secondary to
facet disease.

Vertebrae:  No fracture, evidence of discitis, or bone lesion.

Conus medullaris and cauda equina: Conus extends to the L1 level.
Conus and cauda equina appear normal.

Paraspinal and other soft tissues: No acute paraspinal abnormality.
Small left renal cyst.

Disc levels:

Disc spaces: Degenerative disease with disc height loss at L5-S1.
Disc desiccation at L2-3, L3-4 and L4-5.

T11-12: Minimal broad-based disc bulge. No central canal stenosis.
Bilateral mild facet arthropathy. Mild left foraminal stenosis. No
right foraminal stenosis.

T12-L1: No significant disc bulge. No evidence of neural foraminal
stenosis. No central canal stenosis.

L1-L2: Mild broad-based disc bulge. No evidence of neural foraminal
stenosis. No central canal stenosis.

L2-L3: Broad-based disc bulge eccentric towards the right. Severe
right and moderate left facet arthropathy. Bilateral subarticular
recess narrowing. No evidence of neural foraminal stenosis. Mild
spinal stenosis.

L3-L4: Mild broad-based disc bulge. Severe bilateral facet
arthropathy with bilateral facet effusions. Mild-moderate spinal
stenosis. Bilateral subarticular recess stenosis. Mild right and
moderate left foraminal stenosis.

L4-L5: Broad-based disc bulge. Moderate bilateral facet arthropathy
with bilateral facet effusions. Mild right foraminal stenosis. No
left foraminal stenosis. No central canal stenosis.

L5-S1: Broad-based disc osteophyte complex. Mild left foraminal
stenosis. No right foraminal stenosis. No central canal stenosis.
IMPRESSION: 1. Diffuse lumbar spine spondylosis as described above.
2. No acute osseous injury of the lumbar spine.

## 2020-12-25 ENCOUNTER — Telehealth: Payer: Self-pay | Admitting: Physical Medicine and Rehabilitation

## 2020-12-25 NOTE — Telephone Encounter (Signed)
Left message #1 to schedule OV. 

## 2020-12-25 NOTE — Telephone Encounter (Signed)
Patient called needing to schedule an appointment  with Dr Alvester Morin for his back

## 2020-12-25 NOTE — Telephone Encounter (Signed)
Pt called back regarding message below.

## 2020-12-26 ENCOUNTER — Telehealth: Payer: Self-pay | Admitting: Physical Medicine and Rehabilitation

## 2020-12-26 NOTE — Telephone Encounter (Signed)
Patient called. He would like an appointment with Dr. Alvester Morin. His call back number is [phone]

## 2020-12-30 ENCOUNTER — Ambulatory Visit (INDEPENDENT_AMBULATORY_CARE_PROVIDER_SITE_OTHER): Payer: Medicare Other | Admitting: Physical Medicine and Rehabilitation

## 2020-12-30 ENCOUNTER — Encounter: Payer: Self-pay | Admitting: Physical Medicine and Rehabilitation

## 2020-12-30 ENCOUNTER — Other Ambulatory Visit: Payer: Self-pay

## 2020-12-30 VITALS — BP 130/80 | HR 73

## 2020-12-30 DIAGNOSIS — R208 Other disturbances of skin sensation: Secondary | ICD-10-CM | POA: Diagnosis not present

## 2020-12-30 DIAGNOSIS — M5416 Radiculopathy, lumbar region: Secondary | ICD-10-CM

## 2020-12-30 DIAGNOSIS — M47816 Spondylosis without myelopathy or radiculopathy, lumbar region: Secondary | ICD-10-CM | POA: Diagnosis not present

## 2020-12-30 DIAGNOSIS — M48062 Spinal stenosis, lumbar region with neurogenic claudication: Secondary | ICD-10-CM

## 2020-12-30 NOTE — Progress Notes (Signed)
Pt state lower back pain that travels down both legs and foot. Pt state walking and standing makes the pain worse. Pt state it hard for him to walk because he will fall down.Pt state he takes over the counter pain meds to help ease his pain.  Numeric Pain Rating Scale and Functional Assessment Average Pain 10 Pain Right Now 8 My pain is constant, burning, dull, tingling, and aching Pain is worse with: walking, standing, and some activites Pain improves with: medication   In the last MONTH (on 0-10 scale) has pain interfered with the following?  1. General activity like being  able to carry out your everyday physical activities such as walking, climbing stairs, carrying groceries, or moving a chair?  Rating(7)  2. Relation with others like being able to carry out your usual social activities and roles such as  activities at home, at work and in your community. Rating(8)  3. Enjoyment of life such that you have  been bothered by emotional problems such as feeling anxious, depressed or irritable?  Rating(9)  I

## 2020-12-30 NOTE — Progress Notes (Signed)
SABASTIAN RAIMONDI - 85 y.o. male MRN [id number]  Date of birth: [birth year]  Office Visit Note: Visit Date: 12/30/2020 PCP: Gwenlyn Found, MD Referred by: Gwenlyn Found, MD  Subjective: Chief Complaint  Patient presents with   Left Leg - Pain   Lower Back - Pain   Right Leg - Pain   Left Foot - Pain   Right Foot - Pain   HPI: [name] is a 85 y.o. male who comes in today as a self-referral for evaluation of bilateral lower back pain radiating down posterior legs to heels.  Patient reports pain has been chronic for many years.  Patient states pain is exacerbated by prolonged standing and walking, currently describes as soreness and shooting pain, rates as 8 out of 10. Patient reports some pain relief with Tylenol, stretching exercises and laying flat. Patient's lumbar MRI from 2021 exhibits diffuse lumbar spine spondylosis, severe bilateral facet arthropathy and mild-moderate spinal stenosis at L3-L4, and moderate bilateral facet arthropathy at L4-L5 with associated significant lateral recess narrowing. No high grade spinal stenosis noted. Patient attended physical therapy at [facility], which he reports did help significantly. Patient was previously treated by Dr. Sheran Luz where he had epidural steroid injections with minimal relief. He is currently being managed by Dr. Malon Kindle for chronic knee and shoulder issues. Patient reports his back seems to cause him the most severe pain, however the leg pain makes it difficult to walk and drive. Patient currently uses a cane. Patient denies focal weakness. Patient denies recent trauma or falls. Patient denies bowel/bladder incontinence.   Review of Systems  Musculoskeletal:  Positive for back pain.  Neurological:  Positive for tingling. Negative for focal weakness and weakness.  All other systems reviewed and are negative. Otherwise per HPI.  Assessment & Plan: Visit Diagnoses:    ICD-10-CM   1. Spondylosis without  myelopathy or radiculopathy, lumbar region  M47.816 Ambulatory referral to Physical Medicine Rehab    2. Spinal stenosis of lumbar region with neurogenic claudication  M48.062     3. Facet arthropathy, lumbar  M47.816     4. Lumbar radiculopathy  M54.16     5. Dysesthesia  R20.8        Plan: Findings:  1. Recalcitrant bilateral axial back pain. Patient's bilateral axial back pain is concerning and seems to be the source of excruciating pain.  Patient's MRI exhibits multi-level facet hypertrophy . Patient's clinical presentation and exam are consistent with facet mediated pain. Patient has failed conservative therapies and we believe the next step is to perform diagnostic and hopefully therapeutic bilateral L3-L4 and L4-L5 facet joint injections. If patient does well with facet joint injections we would then consider performing radiofrequency ablation for longer sustained pain relief. We briefly discussed radiofrequency ablation with patient today, he was also given educational material to take home and review.  2. Patient also continues to have chronic, worsening and severe radicular pain to bilateral posterior legs and heels. Patient's MRI exhibits bilateral subarticular recess stenosis at the lateral recess narrowing was L4-5. Patient's clinical presentation and exam are consistent with classic L5 vs S1 distribution. We requested procedure notes from Dr. Sheran Luz office today and will further evaluate for possible lumbar epidural steroid injection to target radicular symptoms after reviewing notes.    Patient encouraged to continue home exercise program and use of medications to help alleviate pain. No red flag symptoms noted upon exam today.     Meds &  Orders: No orders of the defined types were placed in this encounter.   Orders Placed This Encounter  Procedures   Ambulatory referral to Physical Medicine Rehab     Follow-up: Return in about 1 week (around 01/06/2021) for Bilateral  L3-L4 and L4-L5 facet joint injections.   Procedures: No procedures performed      Clinical History: CLINICAL DATA:  Bilateral lower extremity radiculopathy. Worsening pain.   EXAM: MRI LUMBAR SPINE WITHOUT CONTRAST   TECHNIQUE: Multiplanar, multisequence MR imaging of the lumbar spine was performed. No intravenous contrast was administered.   COMPARISON:  06/14/2015   FINDINGS: Segmentation:  Standard.   Alignment: Minimal grade 1 anterolisthesis of L3 on L4 secondary to facet disease.   Vertebrae:  No fracture, evidence of discitis, or bone lesion.   Conus medullaris and cauda equina: Conus extends to the L1 level. Conus and cauda equina appear normal.   Paraspinal and other soft tissues: No acute paraspinal abnormality. Small left renal cyst.   Disc levels:   Disc spaces: Degenerative disease with disc height loss at L5-S1. Disc desiccation at L2-3, L3-4 and L4-5.   T11-12: Minimal broad-based disc bulge. No central canal stenosis. Bilateral mild facet arthropathy. Mild left foraminal stenosis. No right foraminal stenosis.   T12-L1: No significant disc bulge. No evidence of neural foraminal stenosis. No central canal stenosis.   L1-L2: Mild broad-based disc bulge. No evidence of neural foraminal stenosis. No central canal stenosis.   L2-L3: Broad-based disc bulge eccentric towards the right. Severe right and moderate left facet arthropathy. Bilateral subarticular recess narrowing. No evidence of neural foraminal stenosis. Mild spinal stenosis.   L3-L4: Mild broad-based disc bulge. Severe bilateral facet arthropathy with bilateral facet effusions. Mild-moderate spinal stenosis. Bilateral subarticular recess stenosis. Mild right and moderate left foraminal stenosis.   L4-L5: Broad-based disc bulge. Moderate bilateral facet arthropathy with bilateral facet effusions. Mild right foraminal stenosis. No left foraminal stenosis. No central canal stenosis.    L5-S1: Broad-based disc osteophyte complex. Mild left foraminal stenosis. No right foraminal stenosis. No central canal stenosis.   IMPRESSION: 1. Diffuse lumbar spine spondylosis as described above. 2. No acute osseous injury of the lumbar spine.     Electronically Signed   By: Elige Ko   On: 09/22/2019 08:59   He reports that he quit smoking about 45 years ago. His smoking use included cigarettes. He has a 30.00 pack-year smoking history. He has never used smokeless tobacco. No results for input(s): HGBA1C, LABURIC in the last 8760 hours.  Objective:  VS:  HT:    WT:   BMI:     BP:130/80  HR:73bpm  TEMP: ( )  RESP:  Physical Exam HENT:     Head: Normocephalic and atraumatic.     Right Ear: Tympanic membrane normal.     Left Ear: Tympanic membrane normal.     Nose: Nose normal.     Mouth/Throat:     Mouth: Mucous membranes are moist.  Eyes:     Pupils: Pupils are equal, round, and reactive to light.  Cardiovascular:     Rate and Rhythm: Normal rate.     Pulses: Normal pulses.  Pulmonary:     Effort: Pulmonary effort is normal.  Abdominal:     General: Abdomen is flat. There is no distension.  Musculoskeletal:     Cervical back: Normal range of motion and neck supple.     Comments: Pt is slow to rise from seated position to standing. Pain noted upon  facet loading. Strong distal strength without clonus, no pain upon palpation of greater trochanters. No pain noted with bilateral hip flexion/extension. Dysesthesias noted to L5 dermatome bilaterally. Walks with cane, gait is slow and unsteady.    Skin:    General: Skin is warm and dry.     Capillary Refill: Capillary refill takes less than 2 seconds.  Neurological:     Mental Status: He is alert.     Gait: Gait abnormal.  Psychiatric:        Mood and Affect: Mood normal.    Ortho Exam  Imaging: No results found.  Past Medical/Family/Surgical/Social History: Medications & Allergies reviewed per EMR, new  medications updated. Patient Active Problem List   Diagnosis Date Noted   Bilateral primary osteoarthritis of knee 09/25/2019   Anxiety with depression 03/09/2018   Chronic pain 03/09/2018   Normocytic anemia 08/09/2017   BPPV (benign paroxysmal positional vertigo) 12/15/2015   Peripheral polyneuropathy 09/24/2015   Fatigue 09/23/2010   Abnormal ECG 09/23/2010   Hypertension 09/23/2010   Hypercholesterolemia 09/23/2010   ANXIETY 12/01/2009   GASTRITIS 12/01/2009   ABDOMINAL WALL PAIN 12/01/2009   GERD 10/31/2009   DIVERTICULOSIS-COLON 10/31/2009   DEGENERATIVE JOINT DISEASE 10/31/2009   DIARRHEA 10/31/2009   Past Medical History:  Diagnosis Date   Chronic pain    Depression    GAD (generalized anxiety disorder)    GERD (gastroesophageal reflux disease)    Hyperlipidemia    Hypertension    Low back pain radiating to left lower extremity    Low back pain radiating to right lower extremity    Osteoarthritis    Peripheral polyneuropathy    Family History  Problem Relation Age of Onset   Heart attack Brother 51       CABG x5   Coronary artery disease Brother    Heart attack Brother 24       stent   Past Surgical History:  Procedure Laterality Date   CERVICAL FUSION     DECUBITUS ULCER EXCISION     EXTERNAL EAR SURGERY     HEMORRHOID SURGERY     KNEE SURGERY Right    NOSE SURGERY     REVERSE SHOULDER ARTHROPLASTY Right 01/25/2020   Procedure: REVERSE SHOULDER ARTHROPLASTY;  Surgeon: Beverely Low, MD;  Location: [facility];  Service: Orthopedics;  Laterality: Right;   ROTATOR CUFF REPAIR     Social History   Occupational History   Occupation: tool and Scientist, forensic- Training and development officer    Comment: retired  Tobacco Use   Smoking status: Former    Packs/day: 1.00    Years: 30.00    Pack years: 30.00    Types: Cigarettes    Quit date: 09/23/1975    Years since quitting: 45.3   Smokeless tobacco: Never  Vaping Use   Vaping Use: Never used  Substance and Sexual Activity    Alcohol use: No   Drug use: No   Sexual activity: Not on file

## 2021-01-01 ENCOUNTER — Ambulatory Visit: Payer: Self-pay

## 2021-01-01 ENCOUNTER — Other Ambulatory Visit: Payer: Self-pay

## 2021-01-01 ENCOUNTER — Encounter: Payer: Self-pay | Admitting: Physical Medicine and Rehabilitation

## 2021-01-01 ENCOUNTER — Ambulatory Visit (INDEPENDENT_AMBULATORY_CARE_PROVIDER_SITE_OTHER): Payer: Medicare Other | Admitting: Physical Medicine and Rehabilitation

## 2021-01-01 VITALS — BP 122/75 | HR 66

## 2021-01-01 DIAGNOSIS — M47816 Spondylosis without myelopathy or radiculopathy, lumbar region: Secondary | ICD-10-CM | POA: Diagnosis not present

## 2021-01-01 MED ORDER — BUPIVACAINE HCL 0.5 % IJ SOLN
3.0000 mL | Freq: Once | INTRAMUSCULAR | Status: AC
  Administered 2021-01-01: 3 mL

## 2021-01-01 NOTE — Patient Instructions (Signed)

## 2021-01-01 NOTE — Progress Notes (Signed)
Pt state lower back pain that travels down both legs. Pt state standing makes the pain worse. Pt state he take over the counter pain meds to help ease his pain.  Numeric Pain Rating Scale and Functional Assessment Average Pain 8   In the last MONTH (on 0-10 scale) has pain interfered with the following?  1. General activity like being  able to carry out your everyday physical activities such as walking, climbing stairs, carrying groceries, or moving a chair?  Rating(9)   +Driver, -BT, -Dye Allergies.

## 2021-01-11 NOTE — Progress Notes (Signed)
[name] - 85 y.o. male MRN [id number]  Date of birth: Aug 30, 1935  Office Visit Note: Visit Date: 01/01/2021 PCP: Gwenlyn Found, MD Referred by: Gwenlyn Found, MD  Subjective: Chief Complaint  Patient presents with   Lower Back - Pain   Left Leg - Pain   Right Leg - Pain   HPI:  [name] is a 85 y.o. male who comes in today at the request of Ellin Goodie, FNP for planned Bilateral  L3-4 and L4-5 Lumbar facet/medial branch block with fluoroscopic guidance.  The patient has failed conservative care including home exercise, medications, time and activity modification.  This injection will be diagnostic and hopefully therapeutic.  Please see requesting physician notes for further details and justification.  Exam has shown concordant pain with facet joint loading.   ROS Otherwise per HPI.  Assessment & Plan: Visit Diagnoses:    ICD-10-CM   1. Spondylosis without myelopathy or radiculopathy, lumbar region  M47.816 XR C-ARM NO REPORT    Facet Injection    bupivacaine (MARCAINE) 0.5 % (with pres) injection 3 mL      Plan: No additional findings.   Meds & Orders:  Meds ordered this encounter  Medications   bupivacaine (MARCAINE) 0.5 % (with pres) injection 3 mL    Orders Placed This Encounter  Procedures   Facet Injection   XR C-ARM NO REPORT    Follow-up: Return for Review Pain Diary.   Procedures: No procedures performed  Lumbar Diagnostic Facet Joint Nerve Block with Fluoroscopic Guidance   Patient: [name]      Date of Birth: [birth year] MRN: [id number] PCP: Gwenlyn Found, MD      Visit Date: 01/01/2021   Universal Protocol:    Date/Time: 01/11/2209:15 AM  Consent Given By: the patient  Position: PRONE  Additional Comments: Vital signs were monitored before and after the procedure. Patient was prepped and draped in the usual sterile fashion. The correct patient, procedure, and site was verified.   Injection Procedure Details:    Procedure diagnoses:  1. Spondylosis without myelopathy or radiculopathy, lumbar region      Meds Administered:  Meds ordered this encounter  Medications   bupivacaine (MARCAINE) 0.5 % (with pres) injection 3 mL     Laterality: Bilateral  Location/Site: L3-L4, L2 and L3 medial branches and L4-L5, L3 and L4 medial branches  Needle: 5.0 in., 25 ga.  Short bevel or Quincke spinal needle  Needle Placement: Oblique pedical  Findings:   -Comments: There was excellent flow of contrast along the articular pillars without intravascular flow.  Procedure Details: The fluoroscope beam is vertically oriented in AP and then obliqued 15 to 20 degrees to the ipsilateral side of the desired nerve to achieve the "Scotty dog" appearance.  The skin over the target area of the junction of the superior articulating process and the transverse process (sacral ala if blocking the L5 dorsal rami) was locally anesthetized with a 1 ml volume of 1% Lidocaine without Epinephrine.  The spinal needle was inserted and advanced in a trajectory view down to the target.   After contact with periosteum and negative aspirate for blood and CSF, correct placement without intravascular or epidural spread was confirmed by injecting 0.5 ml. of Isovue-250.  A spot radiograph was obtained of this image.    Next, a 0.5 ml. volume of the injectate described above was injected. The needle was then redirected to the other facet joint nerves mentioned above if  needed.  Prior to the procedure, the patient was given a Pain Diary which was completed for baseline measurements.  After the procedure, the patient rated their pain every 30 minutes and will continue rating at this frequency for a total of 5 hours.  The patient has been asked to complete the Diary and return to [place] by mail, fax or hand delivered as soon as possible.   Additional Comments:  No complications occurred Dressing: 2 x 2 sterile gauze and Band-Aid     Post-procedure details: Patient was observed during the procedure. Post-procedure instructions were reviewed.  Patient left the clinic in stable condition.    Clinical History: CLINICAL DATA:  Bilateral lower extremity radiculopathy. Worsening pain.   EXAM: MRI LUMBAR SPINE WITHOUT CONTRAST   TECHNIQUE: Multiplanar, multisequence MR imaging of the lumbar spine was performed. No intravenous contrast was administered.   COMPARISON:  06/14/2015   FINDINGS: Segmentation:  Standard.   Alignment: Minimal grade 1 anterolisthesis of L3 on L4 secondary to facet disease.   Vertebrae:  No fracture, evidence of discitis, or bone lesion.   Conus medullaris and cauda equina: Conus extends to the L1 level. Conus and cauda equina appear normal.   Paraspinal and other soft tissues: No acute paraspinal abnormality. Small left renal cyst.   Disc levels:   Disc spaces: Degenerative disease with disc height loss at L5-S1. Disc desiccation at L2-3, L3-4 and L4-5.   T11-12: Minimal broad-based disc bulge. No central canal stenosis. Bilateral mild facet arthropathy. Mild left foraminal stenosis. No right foraminal stenosis.   T12-L1: No significant disc bulge. No evidence of neural foraminal stenosis. No central canal stenosis.   L1-L2: Mild broad-based disc bulge. No evidence of neural foraminal stenosis. No central canal stenosis.   L2-L3: Broad-based disc bulge eccentric towards the right. Severe right and moderate left facet arthropathy. Bilateral subarticular recess narrowing. No evidence of neural foraminal stenosis. Mild spinal stenosis.   L3-L4: Mild broad-based disc bulge. Severe bilateral facet arthropathy with bilateral facet effusions. Mild-moderate spinal stenosis. Bilateral subarticular recess stenosis. Mild right and moderate left foraminal stenosis.   L4-L5: Broad-based disc bulge. Moderate bilateral facet arthropathy with bilateral facet effusions. Mild right  foraminal stenosis. No left foraminal stenosis. No central canal stenosis.   L5-S1: Broad-based disc osteophyte complex. Mild left foraminal stenosis. No right foraminal stenosis. No central canal stenosis.   IMPRESSION: 1. Diffuse lumbar spine spondylosis as described above. 2. No acute osseous injury of the lumbar spine.     Electronically Signed   By: Elige Ko   On: 09/22/2019 08:59     Objective:  VS:  HT:    WT:   BMI:     BP:122/75  HR:66bpm  TEMP: ( )  RESP:  Physical Exam Vitals and nursing note reviewed.  Constitutional:      General: He is not in acute distress.    Appearance: Normal appearance. He is not ill-appearing.  HENT:     Head: Normocephalic and atraumatic.     Right Ear: External ear normal.     Left Ear: External ear normal.     Nose: No congestion.  Eyes:     Extraocular Movements: Extraocular movements intact.  Cardiovascular:     Rate and Rhythm: Normal rate.     Pulses: Normal pulses.  Pulmonary:     Effort: Pulmonary effort is normal. No respiratory distress.  Abdominal:     General: There is no distension.     Palpations: Abdomen is soft.  Musculoskeletal:        General: No tenderness or signs of injury.     Cervical back: Neck supple.     Right lower leg: No edema.     Left lower leg: No edema.     Comments: Patient has good distal strength without clonus. Patient somewhat slow to rise from a seated position to full extension.  There is concordant low back pain with facet loading and lumbar spine extension rotation.  There are no definitive trigger points but the patient is somewhat tender across the lower back and PSIS.  There is no pain with hip rotation.  Skin:    Findings: No erythema or rash.  Neurological:     General: No focal deficit present.     Mental Status: He is alert and oriented to person, place, and time.     Sensory: No sensory deficit.     Motor: No weakness or abnormal muscle tone.     Coordination:  Coordination normal.  Psychiatric:        Mood and Affect: Mood normal.        Behavior: Behavior normal.     Imaging: No results found.

## 2021-01-11 NOTE — Procedures (Signed)
Lumbar Diagnostic Facet Joint Nerve Block with Fluoroscopic Guidance   Patient: [name]      Date of Birth: 12-Oct-1935 MRN: [id number] PCP: Gwenlyn Found, MD      Visit Date: 01/01/2021   Universal Protocol:    Date/Time: 01/11/2209:15 AM  Consent Given By: the patient  Position: PRONE  Additional Comments: Vital signs were monitored before and after the procedure. Patient was prepped and draped in the usual sterile fashion. The correct patient, procedure, and site was verified.   Injection Procedure Details:   Procedure diagnoses:  1. Spondylosis without myelopathy or radiculopathy, lumbar region      Meds Administered:  Meds ordered this encounter  Medications   bupivacaine (MARCAINE) 0.5 % (with pres) injection 3 mL     Laterality: Bilateral  Location/Site: L3-L4, L2 and L3 medial branches and L4-L5, L3 and L4 medial branches  Needle: 5.0 in., 25 ga.  Short bevel or Quincke spinal needle  Needle Placement: Oblique pedical  Findings:   -Comments: There was excellent flow of contrast along the articular pillars without intravascular flow.  Procedure Details: The fluoroscope beam is vertically oriented in AP and then obliqued 15 to 20 degrees to the ipsilateral side of the desired nerve to achieve the "Scotty dog" appearance.  The skin over the target area of the junction of the superior articulating process and the transverse process (sacral ala if blocking the L5 dorsal rami) was locally anesthetized with a 1 ml volume of 1% Lidocaine without Epinephrine.  The spinal needle was inserted and advanced in a trajectory view down to the target.   After contact with periosteum and negative aspirate for blood and CSF, correct placement without intravascular or epidural spread was confirmed by injecting 0.5 ml. of Isovue-250.  A spot radiograph was obtained of this image.    Next, a 0.5 ml. volume of the injectate described above was injected. The needle was then  redirected to the other facet joint nerves mentioned above if needed.  Prior to the procedure, the patient was given a Pain Diary which was completed for baseline measurements.  After the procedure, the patient rated their pain every 30 minutes and will continue rating at this frequency for a total of 5 hours.  The patient has been asked to complete the Diary and return to [place] by mail, fax or hand delivered as soon as possible.   Additional Comments:  No complications occurred Dressing: 2 x 2 sterile gauze and Band-Aid    Post-procedure details: Patient was observed during the procedure. Post-procedure instructions were reviewed.  Patient left the clinic in stable condition.

## 2021-03-06 ENCOUNTER — Telehealth: Payer: Self-pay

## 2021-03-06 DIAGNOSIS — M545 Low back pain, unspecified: Secondary | ICD-10-CM

## 2021-03-06 NOTE — Telephone Encounter (Signed)
Pt called and would like to schedule an apt with Dr. Alvester Morin for shots in his back

## 2021-03-06 NOTE — Telephone Encounter (Signed)
Scheduled for second set of facet injections- bilateral L3-4 and L4-5. Patient states that he has leg pain and low back pain.

## 2021-03-10 NOTE — Telephone Encounter (Signed)
Patient reports good relief of his low back pain following first facet injections.

## 2021-03-30 ENCOUNTER — Ambulatory Visit (INDEPENDENT_AMBULATORY_CARE_PROVIDER_SITE_OTHER): Payer: Medicare Other | Admitting: Physical Medicine and Rehabilitation

## 2021-03-30 ENCOUNTER — Other Ambulatory Visit: Payer: Self-pay

## 2021-03-30 ENCOUNTER — Encounter: Payer: Self-pay | Admitting: Physical Medicine and Rehabilitation

## 2021-03-30 ENCOUNTER — Ambulatory Visit: Payer: Self-pay

## 2021-03-30 VITALS — BP 150/83 | HR 92

## 2021-03-30 DIAGNOSIS — M47816 Spondylosis without myelopathy or radiculopathy, lumbar region: Secondary | ICD-10-CM

## 2021-03-30 MED ORDER — BUPIVACAINE HCL 0.5 % IJ SOLN
3.0000 mL | Freq: Once | INTRAMUSCULAR | Status: AC
  Administered 2021-03-30: 16:00:00 3 mL

## 2021-03-30 NOTE — Progress Notes (Signed)
Numeric Pain Rating Scale and Functional Assessment Average Pain 10   In the last MONTH (on 0-10 scale) has pain interfered with the following?  1. General activity like being  able to carry out your everyday physical activities such as walking, climbing stairs, carrying groceries, or moving a chair?  Rating(10)   -BT, -Dye Allergies.     Patient is here for Bilateral L3-4 and L4-5 Facets #2. Not taking anything for pain. Uses cane to ambulate. Having some burning sensation, tingling and numbness.

## 2021-04-12 NOTE — Progress Notes (Signed)
[name] - 85 y.o. male MRN [id number]  Date of birth: January 25, 1936  Office Visit Note: Visit Date: 03/30/2021 PCP: Nickola Major, MD Referred by: Nickola Major, MD  Subjective: Chief Complaint  Patient presents with   Lower Back - Pain   HPI:  [name] is a 85 y.o. male who comes in today for planned repeat Bilateral L3-4 and L4-5 Lumbar facet/medial branch block with fluoroscopic guidance.  The patient has failed conservative care including home exercise, medications, time and activity modification.  This injection will be diagnostic and hopefully therapeutic.  Please see requesting physician notes for further details and justification.  Exam shows concordant low back pain with facet joint loading and extension. Patient received more than 80% pain relief from prior injection. This would be the second block in a diagnostic double block paradigm.     Referring:Megan Jimmye Norman, FNP   ROS Otherwise per HPI.  Assessment & Plan: Visit Diagnoses:    ICD-10-CM   1. Spondylosis without myelopathy or radiculopathy, lumbar region  M47.816 XR C-ARM NO REPORT    Facet Injection    bupivacaine (MARCAINE) 0.5 % (with pres) injection 3 mL      Plan: No additional findings.   Meds & Orders:  Meds ordered this encounter  Medications   bupivacaine (MARCAINE) 0.5 % (with pres) injection 3 mL    Orders Placed This Encounter  Procedures   Facet Injection   XR C-ARM NO REPORT    Follow-up: Return for Review Pain Diary.   Procedures: No procedures performed  Lumbar Diagnostic Facet Joint Nerve Block with Fluoroscopic Guidance   Patient: [name]      Date of Birth: [birth year] MRN: [id number] PCP: Nickola Major, MD      Visit Date: 03/30/2021   Universal Protocol:    Date/Time: 12/18/225:08 PM  Consent Given By: the patient  Position: PRONE  Additional Comments: Vital signs were monitored before and after the procedure. Patient was prepped and draped in  the usual sterile fashion. The correct patient, procedure, and site was verified.   Injection Procedure Details:   Procedure diagnoses:  1. Spondylosis without myelopathy or radiculopathy, lumbar region      Meds Administered:  Meds ordered this encounter  Medications   bupivacaine (MARCAINE) 0.5 % (with pres) injection 3 mL     Laterality: Bilateral  Location/Site: L3-L4, L2 and L3 medial branches and L4-L5, L3 and L4 medial branches  Needle: 5.0 in., 25 ga.  Short bevel or Quincke spinal needle  Needle Placement: Oblique pedical  Findings:   -Comments: There was excellent flow of contrast along the articular pillars without intravascular flow.  Procedure Details: The fluoroscope beam is vertically oriented in AP and then obliqued 15 to 20 degrees to the ipsilateral side of the desired nerve to achieve the Scotty dog appearance.  The skin over the target area of the junction of the superior articulating process and the transverse process (sacral ala if blocking the L5 dorsal rami) was locally anesthetized with a 1 ml volume of 1% Lidocaine without Epinephrine.  The spinal needle was inserted and advanced in a trajectory view down to the target.   After contact with periosteum and negative aspirate for blood and CSF, correct placement without intravascular or epidural spread was confirmed by injecting 0.5 ml. of Isovue-250.  A spot radiograph was obtained of this image.    Next, a 0.5 ml. volume of the injectate described above was injected. The  needle was then redirected to the other facet joint nerves mentioned above if needed.  Prior to the procedure, the patient was given a Pain Diary which was completed for baseline measurements.  After the procedure, the patient rated their pain every 30 minutes and will continue rating at this frequency for a total of 5 hours.  The patient has been asked to complete the Diary and return to [place] by mail, fax or hand delivered as soon as  possible.   Additional Comments:  The patient tolerated the procedure well Dressing: 2 x 2 sterile gauze and Band-Aid    Post-procedure details: Patient was observed during the procedure. Post-procedure instructions were reviewed.  Patient left the clinic in stable condition.   Clinical History: CLINICAL DATA:  Bilateral lower extremity radiculopathy. Worsening pain.   EXAM: MRI LUMBAR SPINE WITHOUT CONTRAST   TECHNIQUE: Multiplanar, multisequence MR imaging of the lumbar spine was performed. No intravenous contrast was administered.   COMPARISON:  06/14/2015   FINDINGS: Segmentation:  Standard.   Alignment: Minimal grade 1 anterolisthesis of L3 on L4 secondary to facet disease.   Vertebrae:  No fracture, evidence of discitis, or bone lesion.   Conus medullaris and cauda equina: Conus extends to the L1 level. Conus and cauda equina appear normal.   Paraspinal and other soft tissues: No acute paraspinal abnormality. Small left renal cyst.   Disc levels:   Disc spaces: Degenerative disease with disc height loss at L5-S1. Disc desiccation at L2-3, L3-4 and L4-5.   T11-12: Minimal broad-based disc bulge. No central canal stenosis. Bilateral mild facet arthropathy. Mild left foraminal stenosis. No right foraminal stenosis.   T12-L1: No significant disc bulge. No evidence of neural foraminal stenosis. No central canal stenosis.   L1-L2: Mild broad-based disc bulge. No evidence of neural foraminal stenosis. No central canal stenosis.   L2-L3: Broad-based disc bulge eccentric towards the right. Severe right and moderate left facet arthropathy. Bilateral subarticular recess narrowing. No evidence of neural foraminal stenosis. Mild spinal stenosis.   L3-L4: Mild broad-based disc bulge. Severe bilateral facet arthropathy with bilateral facet effusions. Mild-moderate spinal stenosis. Bilateral subarticular recess stenosis. Mild right and moderate left foraminal  stenosis.   L4-L5: Broad-based disc bulge. Moderate bilateral facet arthropathy with bilateral facet effusions. Mild right foraminal stenosis. No left foraminal stenosis. No central canal stenosis.   L5-S1: Broad-based disc osteophyte complex. Mild left foraminal stenosis. No right foraminal stenosis. No central canal stenosis.   IMPRESSION: 1. Diffuse lumbar spine spondylosis as described above. 2. No acute osseous injury of the lumbar spine.     Electronically Signed   By: Elige Ko   On: 09/22/2019 08:59     Objective:  VS:  HT:     WT:    BMI:      BP:(!) 150/83   HR:92bpm   TEMP: ( )   RESP:  Physical Exam Vitals and nursing note reviewed.  Constitutional:      General: He is not in acute distress.    Appearance: Normal appearance. He is not ill-appearing.  HENT:     Head: Normocephalic and atraumatic.     Right Ear: External ear normal.     Left Ear: External ear normal.     Nose: No congestion.  Eyes:     Extraocular Movements: Extraocular movements intact.  Cardiovascular:     Rate and Rhythm: Normal rate.     Pulses: Normal pulses.  Pulmonary:     Effort: Pulmonary effort is normal.  No respiratory distress.  Abdominal:     General: There is no distension.     Palpations: Abdomen is soft.  Musculoskeletal:        General: No tenderness or signs of injury.     Cervical back: Neck supple.     Right lower leg: No edema.     Left lower leg: No edema.     Comments: Patient has good distal strength without clonus. Patient somewhat slow to rise from a seated position to full extension.  There is concordant low back pain with facet loading and lumbar spine extension rotation.  There are no definitive trigger points but the patient is somewhat tender across the lower back and PSIS.  There is no pain with hip rotation.   Skin:    Findings: No erythema or rash.  Neurological:     General: No focal deficit present.     Mental Status: He is alert and oriented to  person, place, and time.     Sensory: No sensory deficit.     Motor: No weakness or abnormal muscle tone.     Coordination: Coordination normal.  Psychiatric:        Mood and Affect: Mood normal.        Behavior: Behavior normal.     Imaging: No results found.

## 2021-04-12 NOTE — Procedures (Signed)
Lumbar Diagnostic Facet Joint Nerve Block with Fluoroscopic Guidance   Patient: [name]      Date of Birth: [birth year] MRN: [id number] PCP: Gwenlyn Found, MD      Visit Date: 03/30/2021   Universal Protocol:    Date/Time: 12/18/225:08 PM  Consent Given By: the patient  Position: PRONE  Additional Comments: Vital signs were monitored before and after the procedure. Patient was prepped and draped in the usual sterile fashion. The correct patient, procedure, and site was verified.   Injection Procedure Details:   Procedure diagnoses:  1. Spondylosis without myelopathy or radiculopathy, lumbar region      Meds Administered:  Meds ordered this encounter  Medications   bupivacaine (MARCAINE) 0.5 % (with pres) injection 3 mL     Laterality: Bilateral  Location/Site: L3-L4, L2 and L3 medial branches and L4-L5, L3 and L4 medial branches  Needle: 5.0 in., 25 ga.  Short bevel or Quincke spinal needle  Needle Placement: Oblique pedical  Findings:   -Comments: There was excellent flow of contrast along the articular pillars without intravascular flow.  Procedure Details: The fluoroscope beam is vertically oriented in AP and then obliqued 15 to 20 degrees to the ipsilateral side of the desired nerve to achieve the Scotty dog appearance.  The skin over the target area of the junction of the superior articulating process and the transverse process (sacral ala if blocking the L5 dorsal rami) was locally anesthetized with a 1 ml volume of 1% Lidocaine without Epinephrine.  The spinal needle was inserted and advanced in a trajectory view down to the target.   After contact with periosteum and negative aspirate for blood and CSF, correct placement without intravascular or epidural spread was confirmed by injecting 0.5 ml. of Isovue-250.  A spot radiograph was obtained of this image.    Next, a 0.5 ml. volume of the injectate described above was injected. The needle was then  redirected to the other facet joint nerves mentioned above if needed.  Prior to the procedure, the patient was given a Pain Diary which was completed for baseline measurements.  After the procedure, the patient rated their pain every 30 minutes and will continue rating at this frequency for a total of 5 hours.  The patient has been asked to complete the Diary and return to [place] by mail, fax or hand delivered as soon as possible.   Additional Comments:  The patient tolerated the procedure well Dressing: 2 x 2 sterile gauze and Band-Aid    Post-procedure details: Patient was observed during the procedure. Post-procedure instructions were reviewed.  Patient left the clinic in stable condition.

## 2021-06-23 ENCOUNTER — Encounter: Payer: Self-pay | Admitting: Physical Medicine and Rehabilitation

## 2021-06-23 ENCOUNTER — Other Ambulatory Visit: Payer: Self-pay

## 2021-06-23 ENCOUNTER — Ambulatory Visit: Payer: Self-pay

## 2021-06-23 ENCOUNTER — Ambulatory Visit (INDEPENDENT_AMBULATORY_CARE_PROVIDER_SITE_OTHER): Payer: Medicare Other | Admitting: Physical Medicine and Rehabilitation

## 2021-06-23 VITALS — BP 144/84 | HR 77

## 2021-06-23 DIAGNOSIS — M47816 Spondylosis without myelopathy or radiculopathy, lumbar region: Secondary | ICD-10-CM

## 2021-06-23 MED ORDER — METHYLPREDNISOLONE ACETATE 80 MG/ML IJ SUSP
80.0000 mg | Freq: Once | INTRAMUSCULAR | Status: AC
  Administered 2021-06-23: 80 mg

## 2021-06-23 NOTE — Patient Instructions (Signed)

## 2021-06-23 NOTE — Progress Notes (Signed)
Pt state lower back pain that travels down both legs, mostly his right side. Pt state standing makes the pain worse. Pt state he take over the counter pain meds to help ease his pain.  Numeric Pain Rating Scale and Functional Assessment Average Pain 8   In the last MONTH (on 0-10 scale) has pain interfered with the following?  1. General activity like being  able to carry out your everyday physical activities such as walking, climbing stairs, carrying groceries, or moving a chair?  Rating(10)   +Driver, -BT, -Dye Allergies.

## 2021-06-24 NOTE — Progress Notes (Signed)
[name] - 86 y.o. male MRN [id number]  Date of birth: 1935-10-16  Office Visit Note: Visit Date: 06/23/2021 PCP: Nickola Major, MD Referred by: Nickola Major, MD  Subjective: Chief Complaint  Patient presents with   Lower Back - Pain   Right Leg - Pain   Left Leg - Pain   HPI:  [name] is a 86 y.o. male who comes in todayfor planned radiofrequency ablation of the Right L3-4 and L4-5 Lumbar facet joints. This would be ablation of the corresponding medial branches and/or dorsal rami.  Patient has had double diagnostic blocks with more than 50% relief.  These are documented on pain diary.  They have had chronic back pain for quite some time, more than 3 months, which has been an ongoing situation with recalcitrant axial back pain.  They have no radicular pain.  Their axial pain is worse with standing and ambulating and on exam today with facet loading.  They have had physical therapy as well as home exercise program.  The imaging noted in the chart below indicated facet pathology. Accordingly they meet all the criteria and qualification for for radiofrequency ablation and we are going to complete this today hopefully for more longer term relief as part of comprehensive management program.  By way of review, patient followed by Dr. Leana Gamer in our office from an orthopedic and spine standpoint.  He has had prior cervical fusion.  He has been seen in the past by Dr. Joya Salm as well from neurosurgical standpoint.  He has not had any lumbar spine surgery.  Lumbar spine MRI shows mainly facet arthropathy without nerve compression or stenosis.  Patient does have leg pain from peripheral polyneuropathy.  ROS Otherwise per HPI.  Assessment & Plan: Visit Diagnoses:    ICD-10-CM   1. Spondylosis without myelopathy or radiculopathy, lumbar region  M47.816 XR C-ARM NO REPORT    Radiofrequency,Lumbar    methylPREDNISolone acetate (DEPO-MEDROL) injection 80 mg      Plan: No additional  findings.   Meds & Orders:  Meds ordered this encounter  Medications   methylPREDNISolone acetate (DEPO-MEDROL) injection 80 mg    Orders Placed This Encounter  Procedures   Radiofrequency,Lumbar   XR C-ARM NO REPORT    Follow-up: Return if symptoms worsen or fail to improve.   Procedures: No procedures performed  Lumbar Facet Joint Nerve Denervation  Patient: [name]      Date of Birth: Jul 03, 1935 MRN: [id number] PCP: Nickola Major, MD      Visit Date: 06/23/2021   Universal Protocol:    Date/Time: 03/01/235:37 AM  Consent Given By: the patient  Position: PRONE  Additional Comments: Vital signs were monitored before and after the procedure. Patient was prepped and draped in the usual sterile fashion. The correct patient, procedure, and site was verified.   Injection Procedure Details:   Procedure diagnoses:  1. Spondylosis without myelopathy or radiculopathy, lumbar region      Meds Administered:  Meds ordered this encounter  Medications   methylPREDNISolone acetate (DEPO-MEDROL) injection 80 mg     Laterality: Bilateral  Location/Site:  L3-L4, L2 and L3 medial branches and L4-L5, L3 and L4 medial branches  Needle: 18 ga.,  94mm active tip, 193mm RF Cannula  Needle Placement: Along juncture of superior articular process and transverse pocess  Findings:  -Comments:  Procedure Details: For each desired target nerve, the corresponding transverse process (sacral ala for the L5 dorsal rami) was identified  and the fluoroscope was positioned to square off the endplates of the corresponding vertebral body to achieve a true AP midline view.  The beam was then obliqued 15 to 20 degrees and caudally tilted 15 to 20 degrees to line up a trajectory along the target nerves. The skin over the target of the junction of superior articulating process and transverse process (sacral ala for the L5 dorsal rami) was infiltrated with 71ml of 1% Lidocaine without  Epinephrine.  The 18 gauge 47mm active tip outer cannula was advanced in trajectory view to the target.  This procedure was repeated for each target nerve.  Then, for all levels, the outer cannula placement was fine-tuned and the position was then confirmed with bi-planar imaging.    Test stimulation was done both at sensory and motor levels to ensure there was no radicular stimulation. The target tissues were then infiltrated with 1 ml of 1% Lidocaine without Epinephrine. Subsequently, a percutaneous neurotomy was carried out for 90 seconds at 80 degrees Celsius.  After the completion of the lesion, 1 ml of injectate was delivered. It was then repeated for each facet joint nerve mentioned above. Appropriate radiographs were obtained to verify the probe placement during the neurotomy.   Additional Comments:  The patient tolerated the procedure well Dressing: 2 x 2 sterile gauze and Band-Aid    Post-procedure details: Patient was observed during the procedure. Post-procedure instructions were reviewed.  Patient left the clinic in stable condition.       Clinical History: CLINICAL DATA:  Bilateral lower extremity radiculopathy. Worsening pain.   EXAM: MRI LUMBAR SPINE WITHOUT CONTRAST   TECHNIQUE: Multiplanar, multisequence MR imaging of the lumbar spine was performed. No intravenous contrast was administered.   COMPARISON:  06/14/2015   FINDINGS: Segmentation:  Standard.   Alignment: Minimal grade 1 anterolisthesis of L3 on L4 secondary to facet disease.   Vertebrae:  No fracture, evidence of discitis, or bone lesion.   Conus medullaris and cauda equina: Conus extends to the L1 level. Conus and cauda equina appear normal.   Paraspinal and other soft tissues: No acute paraspinal abnormality. Small left renal cyst.   Disc levels:   Disc spaces: Degenerative disease with disc height loss at L5-S1. Disc desiccation at L2-3, L3-4 and L4-5.   T11-12: Minimal broad-based  disc bulge. No central canal stenosis. Bilateral mild facet arthropathy. Mild left foraminal stenosis. No right foraminal stenosis.   T12-L1: No significant disc bulge. No evidence of neural foraminal stenosis. No central canal stenosis.   L1-L2: Mild broad-based disc bulge. No evidence of neural foraminal stenosis. No central canal stenosis.   L2-L3: Broad-based disc bulge eccentric towards the right. Severe right and moderate left facet arthropathy. Bilateral subarticular recess narrowing. No evidence of neural foraminal stenosis. Mild spinal stenosis.   L3-L4: Mild broad-based disc bulge. Severe bilateral facet arthropathy with bilateral facet effusions. Mild-moderate spinal stenosis. Bilateral subarticular recess stenosis. Mild right and moderate left foraminal stenosis.   L4-L5: Broad-based disc bulge. Moderate bilateral facet arthropathy with bilateral facet effusions. Mild right foraminal stenosis. No left foraminal stenosis. No central canal stenosis.   L5-S1: Broad-based disc osteophyte complex. Mild left foraminal stenosis. No right foraminal stenosis. No central canal stenosis.   IMPRESSION: 1. Diffuse lumbar spine spondylosis as described above. 2. No acute osseous injury of the lumbar spine.     Electronically Signed   By: Elige Ko   On: 09/22/2019 08:59     Objective:  VS:  HT:  WT:    BMI:      BP:(!) 144/84   HR:77bpm   TEMP: ( )   RESP:  Physical Exam Vitals and nursing note reviewed.  Constitutional:      General: He is not in acute distress.    Appearance: Normal appearance. He is not ill-appearing.  HENT:     Head: Normocephalic and atraumatic.     Right Ear: External ear normal.     Left Ear: External ear normal.     Nose: No congestion.  Eyes:     Extraocular Movements: Extraocular movements intact.  Cardiovascular:     Rate and Rhythm: Normal rate.     Pulses: Normal pulses.  Pulmonary:     Effort: Pulmonary effort is normal. No  respiratory distress.  Abdominal:     General: There is no distension.     Palpations: Abdomen is soft.  Musculoskeletal:        General: No tenderness or signs of injury.     Cervical back: Neck supple.     Right lower leg: No edema.     Left lower leg: No edema.     Comments: Patient has good distal strength without clonus. Patient somewhat slow to rise from a seated position to full extension.  There is concordant low back pain with facet loading and lumbar spine extension rotation.  There are no definitive trigger points but the patient is somewhat tender across the lower back and PSIS.  There is no pain with hip rotation.   Skin:    Findings: No erythema or rash.  Neurological:     General: No focal deficit present.     Mental Status: He is alert and oriented to person, place, and time.     Sensory: No sensory deficit.     Motor: No weakness or abnormal muscle tone.     Coordination: Coordination normal.  Psychiatric:        Mood and Affect: Mood normal.        Behavior: Behavior normal.     Imaging: XR C-ARM NO REPORT  Result Date: 06/23/2021 Please see Notes tab for imaging impression.

## 2021-06-24 NOTE — Procedures (Signed)
Lumbar Facet Joint Nerve Denervation  Patient: [name]      Date of Birth: 1935-11-24 MRN: [id number] PCP: Gwenlyn Found, MD      Visit Date: 06/23/2021   Universal Protocol:    Date/Time: 03/01/235:37 AM  Consent Given By: the patient  Position: PRONE  Additional Comments: Vital signs were monitored before and after the procedure. Patient was prepped and draped in the usual sterile fashion. The correct patient, procedure, and site was verified.   Injection Procedure Details:   Procedure diagnoses:  1. Spondylosis without myelopathy or radiculopathy, lumbar region      Meds Administered:  Meds ordered this encounter  Medications   methylPREDNISolone acetate (DEPO-MEDROL) injection 80 mg     Laterality: Bilateral  Location/Site:  L3-L4, L2 and L3 medial branches and L4-L5, L3 and L4 medial branches  Needle: 18 ga.,  61mm active tip, RF Cannula  Needle Placement: Along juncture of superior articular process and transverse pocess  Findings:  -Comments:  Procedure Details: For each desired target nerve, the corresponding transverse process (sacral ala for the L5 dorsal rami) was identified and the fluoroscope was positioned to square off the endplates of the corresponding vertebral body to achieve a true AP midline view.  The beam was then obliqued 15 to 20 degrees and caudally tilted 15 to 20 degrees to line up a trajectory along the target nerves. The skin over the target of the junction of superior articulating process and transverse process (sacral ala for the L5 dorsal rami) was infiltrated with 49ml of 1% Lidocaine without Epinephrine.  The 18 gauge 19mm active tip outer cannula was advanced in trajectory view to the target.  This procedure was repeated for each target nerve.  Then, for all levels, the outer cannula placement was fine-tuned and the position was then confirmed with bi-planar imaging.    Test stimulation was done both at sensory and motor  levels to ensure there was no radicular stimulation. The target tissues were then infiltrated with 1 ml of 1% Lidocaine without Epinephrine. Subsequently, a percutaneous neurotomy was carried out for 90 seconds at 80 degrees Celsius.  After the completion of the lesion, 1 ml of injectate was delivered. It was then repeated for each facet joint nerve mentioned above. Appropriate radiographs were obtained to verify the probe placement during the neurotomy.   Additional Comments:  The patient tolerated the procedure well Dressing: 2 x 2 sterile gauze and Band-Aid    Post-procedure details: Patient was observed during the procedure. Post-procedure instructions were reviewed.  Patient left the clinic in stable condition.

## 2021-06-30 ENCOUNTER — Other Ambulatory Visit: Payer: Self-pay

## 2021-06-30 ENCOUNTER — Ambulatory Visit: Payer: Self-pay

## 2021-06-30 ENCOUNTER — Encounter: Payer: Self-pay | Admitting: Physical Medicine and Rehabilitation

## 2021-06-30 ENCOUNTER — Ambulatory Visit (INDEPENDENT_AMBULATORY_CARE_PROVIDER_SITE_OTHER): Payer: Medicare Other | Admitting: Physical Medicine and Rehabilitation

## 2021-06-30 VITALS — BP 121/76 | HR 97

## 2021-06-30 DIAGNOSIS — M47816 Spondylosis without myelopathy or radiculopathy, lumbar region: Secondary | ICD-10-CM | POA: Diagnosis not present

## 2021-06-30 MED ORDER — METHYLPREDNISOLONE ACETATE 80 MG/ML IJ SUSP
80.0000 mg | Freq: Once | INTRAMUSCULAR | Status: AC
  Administered 2021-06-30: 80 mg

## 2021-06-30 NOTE — Progress Notes (Signed)
Pt state lower back pain that travels down left leg. Pt state standing makes the pain worse. Pt state he take over the counter pain meds to help ease his pain. ? ?Numeric Pain Rating Scale and Functional Assessment ?Average Pain 2 ? ? ?In the last MONTH (on 0-10 scale) has pain interfered with the following? ? ?1. General activity like being  able to carry out your everyday physical activities such as walking, climbing stairs, carrying groceries, or moving a chair?  ?Rating(8) ? ? ?+Driver, -BT, -Dye Allergies. ? ?

## 2021-06-30 NOTE — Patient Instructions (Signed)

## 2021-07-11 NOTE — Procedures (Signed)
Lumbar Facet Joint Nerve Denervation ? ?Patient: [name]      ?Date of Birth: 08/20/1935 ?MRN: [id number] ?PCP: Gwenlyn Found, MD      ?Visit Date: 06/30/2021 ?  ?Universal Protocol:    ?Date/Time: 03/18/236:18 PM ? ?Consent Given By: the patient ? ?Position: PRONE ? ?Additional Comments: ?Vital signs were monitored before and after the procedure. ?Patient was prepped and draped in the usual sterile fashion. ?The correct patient, procedure, and site was verified. ? ? ?Injection Procedure Details:  ? ?Procedure diagnoses:  ?1. Spondylosis without myelopathy or radiculopathy, lumbar region   ?  ? ?Meds Administered:  ?Meds ordered this encounter  ?Medications  ? methylPREDNISolone acetate (DEPO-MEDROL) injection 80 mg  ?  ? ?Laterality: Left ? ?Location/Site:  L3-L4, L2 and L3 medial branches and L4-L5, L3 and L4 medial branches ? ?Needle: 18 ga.,  60mm active tip, RF Cannula ? ?Needle Placement: Along juncture of superior articular process and transverse pocess ? ?Findings: ? -Comments: ? ?Procedure Details: ?For each desired target nerve, the corresponding transverse process (sacral ala for the L5 dorsal rami) was identified and the fluoroscope was positioned to square off the endplates of the corresponding vertebral body to achieve a true AP midline view.  The beam was then obliqued 15 to 20 degrees and caudally tilted 15 to 20 degrees to line up a trajectory along the target nerves. The skin over the target of the junction of superior articulating process and transverse process (sacral ala for the L5 dorsal rami) was infiltrated with 29ml of 1% Lidocaine without Epinephrine.  The 18 gauge 58mm active tip outer cannula was advanced in trajectory view to the target. ? ?This procedure was repeated for each target nerve.  Then, for all levels, the outer cannula placement was fine-tuned and the position was then confirmed with bi-planar imaging.   ? ?Test stimulation was done both at sensory and motor  levels to ensure there was no radicular stimulation. The target tissues were then infiltrated with 1 ml of 1% Lidocaine without Epinephrine. Subsequently, a percutaneous neurotomy was carried out for 90 seconds at 80 degrees Celsius.  After the completion of the lesion, 1 ml of injectate was delivered. It was then repeated for each facet joint nerve mentioned above. Appropriate radiographs were obtained to verify the probe placement during the neurotomy. ? ? ?Additional Comments:  ?The patient tolerated the procedure well ?Dressing: 2 x 2 sterile gauze and Band-Aid ?  ? ?Post-procedure details: ?Patient was observed during the procedure. ?Post-procedure instructions were reviewed. ? ?Patient left the clinic in stable condition. ? ? ?  ?

## 2021-07-11 NOTE — Progress Notes (Signed)
? ?BABYBOY LOYA - 86 y.o. male MRN [id number]  Date of birth: [birth year] ? ?Office Visit Note: ?Visit Date: 06/30/2021 ?PCP: Gwenlyn Found, MD ?Referred by: Gwenlyn Found, MD ? ?Subjective: ?Chief Complaint  ?Patient presents with  ? Lower Back - Pain  ? Left Leg - Pain  ? ?HPI:  [name] is a 86 y.o. male who comes in todayfor planned radiofrequency ablation of the Left L3-4 and L4-5 Lumbar facet joints. This would be ablation of the corresponding medial branches and/or dorsal rami.  Patient has had double diagnostic blocks with more than 50% relief.  These are documented on pain diary.  They have had chronic back pain for quite some time, more than 3 months, which has been an ongoing situation with recalcitrant axial back pain.  They have no radicular pain.  Their axial pain is worse with standing and ambulating and on exam today with facet loading.  They have had physical therapy as well as home exercise program.  The imaging noted in the chart below indicated facet pathology. Accordingly they meet all the criteria and qualification for for radiofrequency ablation and we are going to complete this today hopefully for more longer term relief as part of comprehensive management program.  ? ?ROS Otherwise per HPI. ? ?Assessment & Plan: ?Visit Diagnoses:  ?  ICD-10-CM   ?1. Spondylosis without myelopathy or radiculopathy, lumbar region  M47.816 XR C-ARM NO REPORT  ?  Radiofrequency,Lumbar  ?  methylPREDNISolone acetate (DEPO-MEDROL) injection 80 mg  ?  ?  ?Plan: No additional findings.  ? ?Meds & Orders:  ?Meds ordered this encounter  ?Medications  ? methylPREDNISolone acetate (DEPO-MEDROL) injection 80 mg  ?  ?Orders Placed This Encounter  ?Procedures  ? Radiofrequency,Lumbar  ? XR C-ARM NO REPORT  ?  ?Follow-up: Return if symptoms worsen or fail to improve.  ? ?Procedures: ?No procedures performed  ?Lumbar Facet Joint Nerve Denervation ? ?Patient: [name]      ?Date of Birth: November 06, 1935 ?MRN:  [id number] ?PCP: Gwenlyn Found, MD      ?Visit Date: 06/30/2021 ?  ?Universal Protocol:    ?Date/Time: 03/18/236:18 PM ? ?Consent Given By: the patient ? ?Position: PRONE ? ?Additional Comments: ?Vital signs were monitored before and after the procedure. ?Patient was prepped and draped in the usual sterile fashion. ?The correct patient, procedure, and site was verified. ? ? ?Injection Procedure Details:  ? ?Procedure diagnoses:  ?1. Spondylosis without myelopathy or radiculopathy, lumbar region   ?  ? ?Meds Administered:  ?Meds ordered this encounter  ?Medications  ? methylPREDNISolone acetate (DEPO-MEDROL) injection 80 mg  ?  ? ?Laterality: Left ? ?Location/Site:  L3-L4, L2 and L3 medial branches and L4-L5, L3 and L4 medial branches ? ?Needle: 18 ga.,  24mm active tip, RF Cannula ? ?Needle Placement: Along juncture of superior articular process and transverse pocess ? ?Findings: ? -Comments: ? ?Procedure Details: ?For each desired target nerve, the corresponding transverse process (sacral ala for the L5 dorsal rami) was identified and the fluoroscope was positioned to square off the endplates of the corresponding vertebral body to achieve a true AP midline view.  The beam was then obliqued 15 to 20 degrees and caudally tilted 15 to 20 degrees to line up a trajectory along the target nerves. The skin over the target of the junction of superior articulating process and transverse process (sacral ala for the L5 dorsal rami) was infiltrated with 100ml of 1% Lidocaine without Epinephrine.  The 18 gauge 99mm active tip outer cannula was advanced in trajectory view to the target. ? ?This procedure was repeated for each target nerve.  Then, for all levels, the outer cannula placement was fine-tuned and the position was then confirmed with bi-planar imaging.   ? ?Test stimulation was done both at sensory and motor levels to ensure there was no radicular stimulation. The target tissues were then infiltrated with 1 ml  of 1% Lidocaine without Epinephrine. Subsequently, a percutaneous neurotomy was carried out for 90 seconds at 80 degrees Celsius.  After the completion of the lesion, 1 ml of injectate was delivered. It was then repeated for each facet joint nerve mentioned above. Appropriate radiographs were obtained to verify the probe placement during the neurotomy. ? ? ?Additional Comments:  ?The patient tolerated the procedure well ?Dressing: 2 x 2 sterile gauze and Band-Aid ?  ? ?Post-procedure details: ?Patient was observed during the procedure. ?Post-procedure instructions were reviewed. ? ?Patient left the clinic in stable condition. ? ? ?   ? ?Clinical History: ?No specialty comments available.  ? ? ? ?Objective:  VS:  HT:    WT:   BMI:     BP:121/76  HR:97bpm  TEMP: ( )  RESP:  ?Physical Exam ?Vitals and nursing note reviewed.  ?Constitutional:   ?   General: He is not in acute distress. ?   Appearance: Normal appearance. He is not ill-appearing.  ?HENT:  ?   Head: Normocephalic and atraumatic.  ?   Right Ear: External ear normal.  ?   Left Ear: External ear normal.  ?   Nose: No congestion.  ?Eyes:  ?   Extraocular Movements: Extraocular movements intact.  ?Cardiovascular:  ?   Rate and Rhythm: Normal rate.  ?   Pulses: Normal pulses.  ?Pulmonary:  ?   Effort: Pulmonary effort is normal. No respiratory distress.  ?Abdominal:  ?   General: There is no distension.  ?   Palpations: Abdomen is soft.  ?Musculoskeletal:     ?   General: No tenderness or signs of injury.  ?   Cervical back: Neck supple.  ?   Right lower leg: No edema.  ?   Left lower leg: No edema.  ?   Comments: Patient has good distal strength without clonus. Patient somewhat slow to rise from a seated position to full extension.  There is concordant low back pain with facet loading and lumbar spine extension rotation.  There are no definitive trigger points but the patient is somewhat tender across the lower back and PSIS.  There is no pain with hip  rotation.  ?Skin: ?   Findings: No erythema or rash.  ?Neurological:  ?   General: No focal deficit present.  ?   Mental Status: He is alert and oriented to person, place, and time.  ?   Sensory: No sensory deficit.  ?   Motor: No weakness or abnormal muscle tone.  ?   Coordination: Coordination normal.  ?Psychiatric:     ?   Mood and Affect: Mood normal.     ?   Behavior: Behavior normal.  ?  ? ?Imaging: ?No results found. ?

## 2022-09-15 ENCOUNTER — Other Ambulatory Visit: Payer: Self-pay

## 2022-09-15 ENCOUNTER — Emergency Department (HOSPITAL_COMMUNITY)
Admission: EM | Admit: 2022-09-15 | Discharge: 2022-09-15 | Disposition: A | Discharge status: Home / Self Care | Payer: Medicare Other | Attending: Emergency Medicine | Admitting: Emergency Medicine

## 2022-09-15 ENCOUNTER — Emergency Department (HOSPITAL_COMMUNITY): Payer: Medicare Other

## 2022-09-15 DIAGNOSIS — M25512 Pain in left shoulder: Secondary | ICD-10-CM | POA: Insufficient documentation

## 2022-09-15 DIAGNOSIS — Y92019 Unspecified place in single-family (private) house as the place of occurrence of the external cause: Secondary | ICD-10-CM | POA: Insufficient documentation

## 2022-09-15 DIAGNOSIS — W19XXXA Unspecified fall, initial encounter: Secondary | ICD-10-CM | POA: Diagnosis not present

## 2022-09-15 DIAGNOSIS — Z96651 Presence of right artificial knee joint: Secondary | ICD-10-CM | POA: Insufficient documentation

## 2022-09-15 MED ORDER — TRAMADOL HCL 50 MG PO TABS: ORAL_TABLET | ORAL | 0 refills | Status: AC

## 2022-09-15 MED ORDER — TRAMADOL HCL 50 MG PO TABS
50.0000 mg | ORAL_TABLET | Freq: Once | ORAL | Status: AC
  Administered 2022-09-15: 50 mg via ORAL
  Filled 2022-09-15: qty 1

## 2022-09-15 MED ORDER — ACETAMINOPHEN 500 MG PO TABS
1000.0000 mg | ORAL_TABLET | Freq: Once | ORAL | Status: AC
  Administered 2022-09-15: 1000 mg via ORAL
  Filled 2022-09-15: qty 2

## 2022-09-15 NOTE — ED Provider Notes (Signed)
[facility] EMERGENCY DEPARTMENT AT [facility] Provider Note   CSN: [id number] Arrival date & time: 09/15/22  1540     History  Chief Complaint  Patient presents with   Fall    Pt coming from home via [facility] with left shoulder pain due to a fall. No head strike, no thinners.    [name] is a 87 y.o. male.  HPI Patient reports he had a right knee replacement surgery 5\20.  He reports he is using a Cashaw for extra support.  The following day he fell twice in his home.  He reports he fell onto his left back.  He broke the fall with outstretched left arm.  He reports since that time he is having severe pain in the left shoulder and upper arm.  It is almost impossible for him to raise the arm without assistance and he cannot put weight on it to assist with using the [place].  He denies any pain in the elbow wrist or hand.  He denies any neck pain.  He denies he struck his head.  Patient denies that he feels any additional pain or suspects injury to his right knee which is postoperative.  He has on his brace and support.  He reports he is continuing to use his PT device and that is going well with frequent range of motion as well is cooling packs.  He does not feel that his weightbearing ability was at all impacted by the fall, the problems with pain in the left shoulder and arm and use of his Tartt because of this.    Home Medications Prior to Admission medications   Medication Sig Start Date End Date Taking? Authorizing Provider  traMADol (ULTRAM) 50 MG tablet 1 to 2 tablets every 6 hours as needed for pain control. 09/15/22  Yes Arby Barrette, MD  chlorthalidone (HYGROTON) 25 MG tablet Take 25 mg by mouth daily. 08/11/16   [provider]  diazepam (VALIUM) 5 MG tablet Take 2.5 mg by mouth in the morning and at bedtime.     [provider]  ibuprofen (ADVIL) 200 MG tablet Take 400 mg by mouth in the morning and at bedtime.    [provider]   methocarbamol (ROBAXIN) 500 MG tablet Take 1 tablet (500 mg total) by mouth every 8 (eight) hours as needed for muscle spasms. 01/25/20   Beverely Low, MD  sertraline (ZOLOFT) 50 MG tablet Take 50 mg by mouth at bedtime. 08/05/19   [provider]  traZODone (DESYREL) 50 MG tablet Take 50 mg by mouth at bedtime.  07/30/19   [provider]      Allergies    Prednisone, Niaspan [niacin er], Cephalexin, and Cymbalta [duloxetine hcl]    Review of Systems   Review of Systems  Physical Exam Updated Vital Signs BP 132/84   Pulse 87   Temp (!) 97.2 F (36.2 C) (Oral)   Resp 17   Ht 6\' 3"  (1.905 m)   Wt 110.7 kg   SpO2 100%   BMI 30.50 kg/m  Physical Exam Constitutional:      Comments: Patient is alert nontoxic.  Clinically well in appearance.  Well-nourished well-developed.  No acute distress.  HENT:     Head: Normocephalic and atraumatic.     Mouth/Throat:     Pharynx: Oropharynx is clear.  Eyes:     Extraocular Movements: Extraocular movements intact.  Cardiovascular:     Rate and Rhythm: Normal rate and  regular rhythm.  Pulmonary:     Effort: Pulmonary effort is normal.     Breath sounds: Normal breath sounds.     Comments: No reproducible chest wall tenderness to compression.  No visible contusions or abrasions. Abdominal:     General: There is no distension.     Palpations: Abdomen is soft.     Tenderness: There is no abdominal tenderness. There is no guarding.  Musculoskeletal:     Comments: No deformity of the shoulder on the left.  Patient does have point tenderness along the anterior aspect of the shoulder over the anterior humerus.  He does have pain with range of motion at the shoulder.  No pain with range of motion at the elbow wrist or hand.  No swelling or deformity of elbow wrist or hand.  Patient is wearing his dressing and cool pack compression to the right knee.  No complaints of pain to palpation of the lower leg.  No significant swelling or  erythema around the dressings.  Skin:    General: Skin is warm and dry.  Neurological:     General: No focal deficit present.     Mental Status: He is oriented to person, place, and time.     Coordination: Coordination normal.  Psychiatric:        Mood and Affect: Mood normal.     ED Results / Procedures / Treatments   Labs (all labs ordered are listed, but only abnormal results are displayed) Labs Reviewed - No data to display  EKG None  Radiology DG Chest 2 View  Result Date: 09/15/2022 CLINICAL DATA:  Fall EXAM: CHEST - 2 VIEW COMPARISON:  Radiograph 08/24/2016 FINDINGS: Unchanged cardiomediastinal silhouette with tortuous aorta. There is no focal airspace consolidation. There is no pleural effusion or evidence of pneumothorax. Partially visualized cervical spine fusion hardware. Thoracic spondylosis. No acute osseous abnormality. Partially visualized right reverse shoulder arthroplasty. Unchanged lower thoracic compression deformity, stable since May 2018. Unchanged mild anterior wedging of a midthoracic vertebrae. IMPRESSION: No evidence of acute cardiopulmonary disease. No acute osseous abnormality on frontal and lateral views of the chest. Chronic compression deformities in the mid and lower thoracic spine, unchanged since May 2018. Electronically Signed   By: Caprice Renshaw M.D.   On: 09/15/2022 17:25   DG Shoulder Left  Result Date: 09/15/2022 CLINICAL DATA:  Fall left shoulder pain EXAM: LEFT SHOULDER - 2+ VIEW; LEFT HUMERUS - 2+ VIEW COMPARISON:  None Available. FINDINGS: Prior cuff repair. There is no evidence of acute fracture or dislocation involving the shoulder. There is mild glenohumeral and AC joint osteoarthritis. No acute fracture involving the left humerus. There is a 3 mm calcification overlying the anterolateral soft tissues of the upper arm of doubtful clinical significance. IMPRESSION: No evidence of acute fracture. Mild glenohumeral and AC joint osteoarthritis.  Electronically Signed   By: Caprice Renshaw M.D.   On: 09/15/2022 17:22   DG Humerus Left  Result Date: 09/15/2022 CLINICAL DATA:  Fall left shoulder pain EXAM: LEFT SHOULDER - 2+ VIEW; LEFT HUMERUS - 2+ VIEW COMPARISON:  None Available. FINDINGS: Prior cuff repair. There is no evidence of acute fracture or dislocation involving the shoulder. There is mild glenohumeral and AC joint osteoarthritis. No acute fracture involving the left humerus. There is a 3 mm calcification overlying the anterolateral soft tissues of the upper arm of doubtful clinical significance. IMPRESSION: No evidence of acute fracture. Mild glenohumeral and AC joint osteoarthritis. Electronically Signed   By: Gerilyn Pilgrim  Park Breed M.D.   On: 09/15/2022 17:22    Procedures Procedures    Medications Ordered in ED Medications  traMADol (ULTRAM) tablet 50 mg (50 mg Oral Given 09/15/22 1821)  acetaminophen (TYLENOL) tablet 1,000 mg (1,000 mg Oral Given 09/15/22 1821)    ED Course/ Medical Decision Making/ A&P                             Medical Decision Making Amount and/or Complexity of Data Reviewed Radiology: ordered.  Risk OTC drugs. Prescription drug management.   Patient is postoperative knee replacement 3 days ago.  He reports the day after the procedure he fell twice trying to use his Deyarmin and landed on the left shoulder and outstretched arm.  He does not feel that he sustained any injury to the recent postoperative knee.  Patient mechanical fall and pain localized to the left shoulder and arm.  Will proceed with x-rays left shoulder arm and chest.  At this time with no head injury and no associated symptoms I do not feel that patient needs CT scan of the head.  2 view chest x-ray humerus x-ray and shoulder x-ray reviewed by radiology and visually reviewed by myself no acute fractures present.  Patient given dose of acetaminophen and tramadol.  Upon reassessment he has improved and feels good for going home.  He does have  assistance at home.  He advises it will not be a problem for doing transfers with an assist and is continuing to do home therapy for his recently replaced knee.  Patient is discharged in good condition with follow-up plan and return precautions included.  I counseled patient on the use of extra strength Tylenol and addition of 1-2 tramadol tablets as needed for additional pain control.          Final Clinical Impression(s) / ED Diagnoses Final diagnoses:  Fall, initial encounter  Acute pain of left shoulder    Rx / DC Orders ED Discharge Orders          Ordered    traMADol (ULTRAM) 50 MG tablet        09/15/22 1943              Arby Barrette, MD 09/20/22 [phone]

## 2022-09-15 NOTE — ED Notes (Signed)
Pt is a&ox4, pwd. Pt complains of left shoulder pain with decreased ROM. Pt does self splint site when moving. Pt did not hit his head, no thinners. Recently had right knee replacement on Monday and has fallen x 2 since coming home. Pt attached to monitor/vitals. Side rails up x 2, call light in reach.

## 2022-09-15 NOTE — Discharge Instructions (Signed)
1.  Your x-rays do not show any broken bones in the shoulder or arm.  You also had x-rays done of the chest and this time no apparent rib fractures. 2.  After a fall you can have a lot of pain from strained ligaments and muscles.  Also, sometimes there is a hairline fracture in the rib that does not show up on x-ray. 3.  For pain control, you can take extra strength Tylenol every 6 hours and add 1-2 tramadol tablets as needed.  May also apply over-the-counter pain patches such as Salonpas.  Sometimes warm moist heat is also helpful over painful strained muscles. 4.  Follow-up with your doctor for recheck within the next 2 to 3 days. 5.  Only get up with the assistance of your caregiver.  Do not try to walk without someone helping to support and guide you till you have gotten better healing and stability with your knee.

## 2022-09-15 NOTE — ED Triage Notes (Signed)
Pt coming from home with left shoulder pain due to a fall. Pt had right knee replacement on Monday and since has fallen x 2. No head strike, no thinners.

## 2023-04-25 ENCOUNTER — Encounter: Payer: Self-pay | Admitting: Physical Medicine and Rehabilitation

## 2023-04-25 ENCOUNTER — Ambulatory Visit: Payer: Medicare Other | Admitting: Physical Medicine and Rehabilitation

## 2023-04-25 DIAGNOSIS — M47819 Spondylosis without myelopathy or radiculopathy, site unspecified: Secondary | ICD-10-CM

## 2023-04-25 DIAGNOSIS — M47816 Spondylosis without myelopathy or radiculopathy, lumbar region: Secondary | ICD-10-CM | POA: Diagnosis not present

## 2023-04-25 DIAGNOSIS — G8929 Other chronic pain: Secondary | ICD-10-CM

## 2023-04-25 DIAGNOSIS — M545 Low back pain, unspecified: Secondary | ICD-10-CM

## 2023-04-25 MED ORDER — GABAPENTIN 300 MG PO CAPS: 300.0000 mg | ORAL_CAPSULE | Freq: Three times a day (TID) | ORAL | 2 refills | Status: DC

## 2023-04-25 NOTE — Progress Notes (Signed)
Bil hip pain Pain for a while now with no injury.  Tylenol for pain as needed.  He uses a cane to get around.  Sitting down causes pain

## 2023-05-02 ENCOUNTER — Other Ambulatory Visit: Payer: Self-pay | Admitting: Physical Medicine and Rehabilitation

## 2023-05-16 ENCOUNTER — Other Ambulatory Visit: Payer: Self-pay

## 2023-05-16 ENCOUNTER — Ambulatory Visit (INDEPENDENT_AMBULATORY_CARE_PROVIDER_SITE_OTHER): Payer: Medicare Other | Admitting: Physical Medicine and Rehabilitation

## 2023-05-16 DIAGNOSIS — M47816 Spondylosis without myelopathy or radiculopathy, lumbar region: Secondary | ICD-10-CM

## 2023-05-16 MED ORDER — METHYLPREDNISOLONE ACETATE 40 MG/ML IJ SUSP
40.0000 mg | Freq: Once | INTRAMUSCULAR | Status: AC
Start: 2023-05-16 — End: 2023-05-16
  Administered 2023-05-16: 40 mg

## 2023-05-16 NOTE — Patient Instructions (Signed)

## 2023-05-16 NOTE — Progress Notes (Signed)
Functional Pain Scale - descriptive words and definitions  Uncomfortable (3)  Pain is present but can complete all ADL's/sleep is slightly affected and passive distraction only gives marginal relief. Mild range order  Average Pain 3   +Driver, -BT, -Dye Allergies.

## 2023-05-26 ENCOUNTER — Ambulatory Visit (INDEPENDENT_AMBULATORY_CARE_PROVIDER_SITE_OTHER): Payer: Medicare Other | Admitting: Physical Medicine and Rehabilitation

## 2023-05-26 ENCOUNTER — Other Ambulatory Visit: Payer: Self-pay

## 2023-05-26 DIAGNOSIS — M47816 Spondylosis without myelopathy or radiculopathy, lumbar region: Secondary | ICD-10-CM | POA: Diagnosis not present

## 2023-05-26 MED ORDER — METHYLPREDNISOLONE ACETATE 40 MG/ML IJ SUSP
40.0000 mg | Freq: Once | INTRAMUSCULAR | Status: DC
Start: 2023-05-26 — End: 2023-09-26

## 2023-05-26 NOTE — Patient Instructions (Signed)

## 2023-05-26 NOTE — Procedures (Signed)
Lumbar Facet Joint Nerve Denervation  Patient: [name]      Date of Birth: 1935-05-14 MRN: [id number] PCP: Gwenlyn Found, MD      Visit Date: 05/16/2023   Universal Protocol:    Date/Time: 01/30/258:59 AM  Consent Given By: the patient  Position: PRONE  Additional Comments: Vital signs were monitored before and after the procedure. Patient was prepped and draped in the usual sterile fashion. The correct patient, procedure, and site was verified.   Injection Procedure Details:   Procedure diagnoses:  1. Spondylosis without myelopathy or radiculopathy, lumbar region      Meds Administered:  Meds ordered this encounter  Medications   methylPREDNISolone acetate (DEPO-MEDROL) injection 40 mg     Laterality: Right  Location/Site:  L3-L4, L2 and L3 medial branches and L4-L5, L3 and L4 medial branches  Needle: 18 ga.,  10mm active tip, RF Cannula  Needle Placement: Along juncture of superior articular process and transverse pocess  Findings:  -Comments:  Procedure Details: For each desired target nerve, the corresponding transverse process (sacral ala for the L5 dorsal rami) was identified and the fluoroscope was positioned to square off the endplates of the corresponding vertebral body to achieve a true AP midline view.  The beam was then obliqued 15 to 20 degrees and caudally tilted 15 to 20 degrees to line up a trajectory along the target nerves. The skin over the target of the junction of superior articulating process and transverse process (sacral ala for the L5 dorsal rami) was infiltrated with 1ml of 1% Lidocaine without Epinephrine.  The 18 gauge 10mm active tip outer cannula was advanced in trajectory view to the target.  This procedure was repeated for each target nerve.  Then, for all levels, the outer cannula placement was fine-tuned and the position was then confirmed with bi-planar imaging.    Test stimulation was done both at sensory and motor  levels to ensure there was no radicular stimulation. The target tissues were then infiltrated with 1 ml of 1% Lidocaine without Epinephrine. Subsequently, a percutaneous neurotomy was carried out for 90 seconds at 80 degrees Celsius.  After the completion of the lesion, 1 ml of injectate was delivered. It was then repeated for each facet joint nerve mentioned above. Appropriate radiographs were obtained to verify the probe placement during the neurotomy.   Additional Comments:  The patient tolerated the procedure well Dressing: 2 x 2 sterile gauze and Band-Aid    Post-procedure details: Patient was observed during the procedure. Post-procedure instructions were reviewed.  Patient left the clinic in stable condition.

## 2023-05-26 NOTE — Procedures (Signed)
Lumbar Facet Joint Nerve Denervation  Patient: [name]      Date of Birth: [birth year] MRN: [id number] PCP: Gwenlyn Found, MD      Visit Date: 05/26/2023   Universal Protocol:    Date/Time: 01/30/254:42 PM  Consent Given By: the patient  Position: PRONE  Additional Comments: Vital signs were monitored before and after the procedure. Patient was prepped and draped in the usual sterile fashion. The correct patient, procedure, and site was verified.   Injection Procedure Details:   Procedure diagnoses:  1. Spondylosis without myelopathy or radiculopathy, lumbar region      Meds Administered:  Meds ordered this encounter  Medications   methylPREDNISolone acetate (DEPO-MEDROL) injection 40 mg     Laterality: Left  Location/Site:  L3-L4, L2 and L3 medial branches and L4-L5, L3 and L4 medial branches  Needle: 18 ga.,  10mm active tip, RF Cannula  Needle Placement: Along juncture of superior articular process and transverse pocess  Findings:  -Comments:  Procedure Details: For each desired target nerve, the corresponding transverse process (sacral ala for the L5 dorsal rami) was identified and the fluoroscope was positioned to square off the endplates of the corresponding vertebral body to achieve a true AP midline view.  The beam was then obliqued 15 to 20 degrees and caudally tilted 15 to 20 degrees to line up a trajectory along the target nerves. The skin over the target of the junction of superior articulating process and transverse process (sacral ala for the L5 dorsal rami) was infiltrated with 1ml of 1% Lidocaine without Epinephrine.  The 18 gauge 10mm active tip outer cannula was advanced in trajectory view to the target.  This procedure was repeated for each target nerve.  Then, for all levels, the outer cannula placement was fine-tuned and the position was then confirmed with bi-planar imaging.    Test stimulation was done both at sensory and motor  levels to ensure there was no radicular stimulation. The target tissues were then infiltrated with 1 ml of 1% Lidocaine without Epinephrine. Subsequently, a percutaneous neurotomy was carried out for 90 seconds at 80 degrees Celsius.  After the completion of the lesion, 1 ml of injectate was delivered. It was then repeated for each facet joint nerve mentioned above. Appropriate radiographs were obtained to verify the probe placement during the neurotomy.   Additional Comments:  The patient tolerated the procedure well Dressing: 2 x 2 sterile gauze and Band-Aid    Post-procedure details: Patient was observed during the procedure. Post-procedure instructions were reviewed.  Patient left the clinic in stable condition.

## 2023-05-26 NOTE — Progress Notes (Signed)
[name] - 88 y.o. male MRN [id number]  Date of birth: 23-May-1935  Office Visit Note: Visit Date: 05/16/2023 PCP: Gwenlyn Found, MD Referred by: Gwenlyn Found, MD  Subjective: Chief Complaint  Patient presents with   Lower Back - Pain   HPI:  [name] is a 88 y.o. male who comes in todayfor planned repeat radiofrequency ablation of the Right L3-4 and L4-5  Lumbar facet joints. This would be ablation of the corresponding medial branches and/or dorsal rami.  Patient has had double diagnostic blocks with more than 70% relief.  Subsequent ablation gave them more than 6 months of over 60% relief.  They have had chronic back pain for quite some time, more than 3 months, which has been an ongoing situation with recalcitrant axial back pain.  They have no radicular pain.  Their axial pain is worse with standing and ambulating and on exam today with facet loading.  They have had physical therapy as well as home exercise program.  The imaging noted in the chart below indicated facet pathology. Accordingly they meet all the criteria and qualification for for radiofrequency ablation and we are going to complete this today hopefully for more longer term relief as part of comprehensive management program.   ROS Otherwise per HPI.  Assessment & Plan: Visit Diagnoses:    ICD-10-CM   1. Spondylosis without myelopathy or radiculopathy, lumbar region  M47.816 XR C-ARM NO REPORT    Radiofrequency,Lumbar    methylPREDNISolone acetate (DEPO-MEDROL) injection 40 mg      Plan: No additional findings.   Meds & Orders:  Meds ordered this encounter  Medications   methylPREDNISolone acetate (DEPO-MEDROL) injection 40 mg    Orders Placed This Encounter  Procedures   Radiofrequency,Lumbar   XR C-ARM NO REPORT    Follow-up: Return if symptoms worsen or fail to improve.   Procedures: No procedures performed  Lumbar Facet Joint Nerve Denervation  Patient: [name]      Date of  Birth: June 24, 1935 MRN: [id number] PCP: Gwenlyn Found, MD      Visit Date: 05/16/2023   Universal Protocol:    Date/Time: 01/30/258:59 AM  Consent Given By: the patient  Position: PRONE  Additional Comments: Vital signs were monitored before and after the procedure. Patient was prepped and draped in the usual sterile fashion. The correct patient, procedure, and site was verified.   Injection Procedure Details:   Procedure diagnoses:  1. Spondylosis without myelopathy or radiculopathy, lumbar region      Meds Administered:  Meds ordered this encounter  Medications   methylPREDNISolone acetate (DEPO-MEDROL) injection 40 mg     Laterality: Right  Location/Site:  L3-L4, L2 and L3 medial branches and L4-L5, L3 and L4 medial branches  Needle: 18 ga.,  10mm active tip, RF Cannula  Needle Placement: Along juncture of superior articular process and transverse pocess  Findings:  -Comments:  Procedure Details: For each desired target nerve, the corresponding transverse process (sacral ala for the L5 dorsal rami) was identified and the fluoroscope was positioned to square off the endplates of the corresponding vertebral body to achieve a true AP midline view.  The beam was then obliqued 15 to 20 degrees and caudally tilted 15 to 20 degrees to line up a trajectory along the target nerves. The skin over the target of the junction of superior articulating process and transverse process (sacral ala for the L5 dorsal rami) was infiltrated with 1ml of 1% Lidocaine  without Epinephrine.  The 18 gauge 10mm active tip outer cannula was advanced in trajectory view to the target.  This procedure was repeated for each target nerve.  Then, for all levels, the outer cannula placement was fine-tuned and the position was then confirmed with bi-planar imaging.    Test stimulation was done both at sensory and motor levels to ensure there was no radicular stimulation. The target tissues were  then infiltrated with 1 ml of 1% Lidocaine without Epinephrine. Subsequently, a percutaneous neurotomy was carried out for 90 seconds at 80 degrees Celsius.  After the completion of the lesion, 1 ml of injectate was delivered. It was then repeated for each facet joint nerve mentioned above. Appropriate radiographs were obtained to verify the probe placement during the neurotomy.   Additional Comments:  The patient tolerated the procedure well Dressing: 2 x 2 sterile gauze and Band-Aid    Post-procedure details: Patient was observed during the procedure. Post-procedure instructions were reviewed.  Patient left the clinic in stable condition.      Clinical History: CLINICAL DATA:  Bilateral lower extremity radiculopathy. Worsening pain.   EXAM: MRI LUMBAR SPINE WITHOUT CONTRAST   TECHNIQUE: Multiplanar, multisequence MR imaging of the lumbar spine was performed. No intravenous contrast was administered.   COMPARISON:  06/14/2015   FINDINGS: Segmentation:  Standard.   Alignment: Minimal grade 1 anterolisthesis of L3 on L4 secondary to facet disease.   Vertebrae:  No fracture, evidence of discitis, or bone lesion.   Conus medullaris and cauda equina: Conus extends to the L1 level. Conus and cauda equina appear normal.   Paraspinal and other soft tissues: No acute paraspinal abnormality. Small left renal cyst.   Disc levels:   Disc spaces: Degenerative disease with disc height loss at L5-S1. Disc desiccation at L2-3, L3-4 and L4-5.   T11-12: Minimal broad-based disc bulge. No central canal stenosis. Bilateral mild facet arthropathy. Mild left foraminal stenosis. No right foraminal stenosis.   T12-L1: No significant disc bulge. No evidence of neural foraminal stenosis. No central canal stenosis.   L1-L2: Mild broad-based disc bulge. No evidence of neural foraminal stenosis. No central canal stenosis.   L2-L3: Broad-based disc bulge eccentric towards the right.  Severe right and moderate left facet arthropathy. Bilateral subarticular recess narrowing. No evidence of neural foraminal stenosis. Mild spinal stenosis.   L3-L4: Mild broad-based disc bulge. Severe bilateral facet arthropathy with bilateral facet effusions. Mild-moderate spinal stenosis. Bilateral subarticular recess stenosis. Mild right and moderate left foraminal stenosis.   L4-L5: Broad-based disc bulge. Moderate bilateral facet arthropathy with bilateral facet effusions. Mild right foraminal stenosis. No left foraminal stenosis. No central canal stenosis.   L5-S1: Broad-based disc osteophyte complex. Mild left foraminal stenosis. No right foraminal stenosis. No central canal stenosis.   IMPRESSION: 1. Diffuse lumbar spine spondylosis as described above. 2. No acute osseous injury of the lumbar spine.     Electronically Signed   By: Elige Ko   On: 09/22/2019 08:59     Objective:  VS:  HT:    WT:   BMI:     BP:   HR: bpm  TEMP: ( )  RESP:  Physical Exam Vitals and nursing note reviewed.  Constitutional:      General: He is not in acute distress.    Appearance: Normal appearance. He is not ill-appearing.  HENT:     Head: Normocephalic and atraumatic.     Right Ear: External ear normal.     Left Ear: External  ear normal.     Nose: No congestion.  Eyes:     Extraocular Movements: Extraocular movements intact.  Cardiovascular:     Rate and Rhythm: Normal rate.     Pulses: Normal pulses.  Pulmonary:     Effort: Pulmonary effort is normal. No respiratory distress.  Abdominal:     General: There is no distension.     Palpations: Abdomen is soft.  Musculoskeletal:        General: No tenderness or signs of injury.     Cervical back: Neck supple.     Right lower leg: No edema.     Left lower leg: No edema.     Comments: Patient has good distal strength without clonus.  Skin:    Findings: No erythema or rash.  Neurological:     General: No focal deficit  present.     Mental Status: He is alert and oriented to person, place, and time.     Sensory: No sensory deficit.     Motor: No weakness or abnormal muscle tone.     Coordination: Coordination normal.  Psychiatric:        Mood and Affect: Mood normal.        Behavior: Behavior normal.      Imaging: No results found.

## 2023-05-26 NOTE — Progress Notes (Signed)
Functional Pain Scale - descriptive words and definitions  Uncomfortable (3)  Pain is present but can complete all ADL's/sleep is slightly affected and passive distraction only gives marginal relief. Mild range order  Average Pain 4  L side.  +Driver, -BT, -Dye Allergies.

## 2023-05-26 NOTE — Progress Notes (Signed)
[name] - 88 y.o. male MRN [id number]  Date of birth: 1935/06/09  Office Visit Note: Visit Date: 05/26/2023 PCP: Gwenlyn Found, MD Referred by: Gwenlyn Found, MD  Subjective: Chief Complaint  Patient presents with   Lower Back - Pain   HPI:  [name] is a 88 y.o. male who comes in todayfor planned radiofrequency ablation of the Left L3-4 and L4-5 Lumbar facet joints. This would be ablation of the corresponding medial branches and/or dorsal rami.  Patient has had double diagnostic blocks with more than 50% relief.  These are documented on pain diary.  They have had chronic back pain for quite some time, more than 3 months, which has been an ongoing situation with recalcitrant axial back pain.  They have no radicular pain.  Their axial pain is worse with standing and ambulating and on exam today with facet loading.  They have had physical therapy as well as home exercise program.  The imaging noted in the chart below indicated facet pathology. Accordingly they meet all the criteria and qualification for for radiofrequency ablation and we are going to complete this today hopefully for more longer term relief as part of comprehensive management program.  He notes that he get a lot of burning pain in the "hips" - Points to the upper buttocks - when walking to the mailbox.  Prior lumbar MRI was from 2021 showing mild to moderate stenosis at L3-4 Early wonder if this is worsened.  Consider new MRI versus injection at L3-4.   ROS Otherwise per HPI.  Assessment & Plan: Visit Diagnoses:    ICD-10-CM   1. Spondylosis without myelopathy or radiculopathy, lumbar region  M47.816 XR C-ARM NO REPORT    Radiofrequency,Lumbar    methylPREDNISolone acetate (DEPO-MEDROL) injection 40 mg      Plan: No additional findings.   Meds & Orders:  Meds ordered this encounter  Medications   methylPREDNISolone acetate (DEPO-MEDROL) injection 40 mg    Orders Placed This Encounter  Procedures    Radiofrequency,Lumbar   XR C-ARM NO REPORT    Follow-up: Return if symptoms worsen or fail to improve.   Procedures: No procedures performed  Lumbar Facet Joint Nerve Denervation  Patient: [name]      Date of Birth: 07-Dec-1935 MRN: [id number] PCP: Gwenlyn Found, MD      Visit Date: 05/26/2023   Universal Protocol:    Date/Time: 01/30/254:42 PM  Consent Given By: the patient  Position: PRONE  Additional Comments: Vital signs were monitored before and after the procedure. Patient was prepped and draped in the usual sterile fashion. The correct patient, procedure, and site was verified.   Injection Procedure Details:   Procedure diagnoses:  1. Spondylosis without myelopathy or radiculopathy, lumbar region      Meds Administered:  Meds ordered this encounter  Medications   methylPREDNISolone acetate (DEPO-MEDROL) injection 40 mg     Laterality: Left  Location/Site:  L3-L4, L2 and L3 medial branches and L4-L5, L3 and L4 medial branches  Needle: 18 ga.,  10mm active tip, RF Cannula  Needle Placement: Along juncture of superior articular process and transverse pocess  Findings:  -Comments:  Procedure Details: For each desired target nerve, the corresponding transverse process (sacral ala for the L5 dorsal rami) was identified and the fluoroscope was positioned to square off the endplates of the corresponding vertebral body to achieve a true AP midline view.  The beam was then obliqued 15 to 20  degrees and caudally tilted 15 to 20 degrees to line up a trajectory along the target nerves. The skin over the target of the junction of superior articulating process and transverse process (sacral ala for the L5 dorsal rami) was infiltrated with 1ml of 1% Lidocaine without Epinephrine.  The 18 gauge 10mm active tip outer cannula was advanced in trajectory view to the target.  This procedure was repeated for each target nerve.  Then, for all levels, the outer  cannula placement was fine-tuned and the position was then confirmed with bi-planar imaging.    Test stimulation was done both at sensory and motor levels to ensure there was no radicular stimulation. The target tissues were then infiltrated with 1 ml of 1% Lidocaine without Epinephrine. Subsequently, a percutaneous neurotomy was carried out for 90 seconds at 80 degrees Celsius.  After the completion of the lesion, 1 ml of injectate was delivered. It was then repeated for each facet joint nerve mentioned above. Appropriate radiographs were obtained to verify the probe placement during the neurotomy.   Additional Comments:  The patient tolerated the procedure well Dressing: 2 x 2 sterile gauze and Band-Aid    Post-procedure details: Patient was observed during the procedure. Post-procedure instructions were reviewed.  Patient left the clinic in stable condition.      Clinical History: CLINICAL DATA:  Bilateral lower extremity radiculopathy. Worsening pain.   EXAM: MRI LUMBAR SPINE WITHOUT CONTRAST   TECHNIQUE: Multiplanar, multisequence MR imaging of the lumbar spine was performed. No intravenous contrast was administered.   COMPARISON:  06/14/2015   FINDINGS: Segmentation:  Standard.   Alignment: Minimal grade 1 anterolisthesis of L3 on L4 secondary to facet disease.   Vertebrae:  No fracture, evidence of discitis, or bone lesion.   Conus medullaris and cauda equina: Conus extends to the L1 level. Conus and cauda equina appear normal.   Paraspinal and other soft tissues: No acute paraspinal abnormality. Small left renal cyst.   Disc levels:   Disc spaces: Degenerative disease with disc height loss at L5-S1. Disc desiccation at L2-3, L3-4 and L4-5.   T11-12: Minimal broad-based disc bulge. No central canal stenosis. Bilateral mild facet arthropathy. Mild left foraminal stenosis. No right foraminal stenosis.   T12-L1: No significant disc bulge. No evidence of  neural foraminal stenosis. No central canal stenosis.   L1-L2: Mild broad-based disc bulge. No evidence of neural foraminal stenosis. No central canal stenosis.   L2-L3: Broad-based disc bulge eccentric towards the right. Severe right and moderate left facet arthropathy. Bilateral subarticular recess narrowing. No evidence of neural foraminal stenosis. Mild spinal stenosis.   L3-L4: Mild broad-based disc bulge. Severe bilateral facet arthropathy with bilateral facet effusions. Mild-moderate spinal stenosis. Bilateral subarticular recess stenosis. Mild right and moderate left foraminal stenosis.   L4-L5: Broad-based disc bulge. Moderate bilateral facet arthropathy with bilateral facet effusions. Mild right foraminal stenosis. No left foraminal stenosis. No central canal stenosis.   L5-S1: Broad-based disc osteophyte complex. Mild left foraminal stenosis. No right foraminal stenosis. No central canal stenosis.   IMPRESSION: 1. Diffuse lumbar spine spondylosis as described above. 2. No acute osseous injury of the lumbar spine.     Electronically Signed   By: Elige Ko   On: 09/22/2019 08:59     Objective:  VS:  HT:    WT:   BMI:     BP:   HR: bpm  TEMP: ( )  RESP:  Physical Exam Vitals and nursing note reviewed.  Constitutional:  General: He is not in acute distress.    Appearance: Normal appearance. He is not ill-appearing.  HENT:     Head: Normocephalic and atraumatic.     Right Ear: External ear normal.     Left Ear: External ear normal.     Nose: No congestion.  Eyes:     Extraocular Movements: Extraocular movements intact.  Cardiovascular:     Rate and Rhythm: Normal rate.     Pulses: Normal pulses.  Pulmonary:     Effort: Pulmonary effort is normal. No respiratory distress.  Abdominal:     General: There is no distension.     Palpations: Abdomen is soft.  Musculoskeletal:        General: No tenderness or signs of injury.     Cervical back: Neck  supple.     Right lower leg: No edema.     Left lower leg: No edema.     Comments: Patient has good distal strength without clonus.  Skin:    Findings: No erythema or rash.  Neurological:     General: No focal deficit present.     Mental Status: He is alert and oriented to person, place, and time.     Sensory: No sensory deficit.     Motor: No weakness or abnormal muscle tone.     Coordination: Coordination normal.  Psychiatric:        Mood and Affect: Mood normal.        Behavior: Behavior normal.      Imaging: XR C-ARM NO REPORT Result Date: 05/26/2023 Please see Notes tab for imaging impression.

## 2023-07-13 ENCOUNTER — Encounter: Payer: Self-pay | Admitting: Physical Medicine and Rehabilitation

## 2023-07-13 ENCOUNTER — Ambulatory Visit: Admitting: Physical Medicine and Rehabilitation

## 2023-07-13 DIAGNOSIS — M47816 Spondylosis without myelopathy or radiculopathy, lumbar region: Secondary | ICD-10-CM | POA: Diagnosis not present

## 2023-07-13 DIAGNOSIS — M5441 Lumbago with sciatica, right side: Secondary | ICD-10-CM

## 2023-07-13 DIAGNOSIS — R269 Unspecified abnormalities of gait and mobility: Secondary | ICD-10-CM | POA: Diagnosis not present

## 2023-07-13 DIAGNOSIS — G8929 Other chronic pain: Secondary | ICD-10-CM

## 2023-07-13 DIAGNOSIS — M5416 Radiculopathy, lumbar region: Secondary | ICD-10-CM | POA: Diagnosis not present

## 2023-07-13 DIAGNOSIS — M5442 Lumbago with sciatica, left side: Secondary | ICD-10-CM

## 2023-07-13 NOTE — Progress Notes (Signed)
 [name] - 88 y.o. male MRN [id number]  Date of birth: 04/25/1936  Office Visit Note: Visit Date: 07/13/2023 PCP: Gwenlyn Found, MD Referred by: Gwenlyn Found, MD  Subjective: Chief Complaint  Patient presents with   Right Shoulder - Pain   Left Shoulder - Pain   HPI: [name] is a 88 y.o. male who comes in today for evaluation of chronic, worsening and severe bilateral lower back pain radiating to hips and down legs. He reports pain and numbness sensation to legs. Pain ongoing for several years, worsens with prolonged standing and walking. He describes pain as sore and burning sensation, currently rates as 8 out of 10. Some relief of pain with home exercise regimen, rest and use of medications. He recently completed regimen of PT for more knee issues, gait and balance. Patient's lumbar MRI from 2021 exhibits diffuse lumbar spine spondylosis, severe bilateral facet arthropathy and mild-moderate spinal stenosis at L3-L4, and moderate bilateral facet arthropathy at L4-L5 with associated significant lateral recess narrowing. No high grade spinal stenosis noted. He recently underwent bilateral L3-L4 and L4-L5 radiofrequency ablation in our office on 05/26/2023. He reports no relief of pain with this procedure. He does have history of frequent falls, uses cane to ambulate. Patient denies focal weakness. He reports most recent fall about 1 month ago, he was evaluated by EMT at his house, no obvious injuries noted.      Review of Systems  Musculoskeletal:  Positive for back pain and myalgias.  Neurological:  Positive for tingling. Negative for focal weakness and weakness.  All other systems reviewed and are negative.  Otherwise per HPI.  Assessment & Plan: Visit Diagnoses:    ICD-10-CM   1. Chronic bilateral low back pain with bilateral sciatica  M54.42 MR LUMBAR SPINE WO CONTRAST   M54.41    G89.29     2. Lumbar radiculopathy  M54.16 MR LUMBAR SPINE WO CONTRAST    3.  Facet arthropathy, lumbar  M47.816     4. Gait abnormality  R26.9        Plan: Findings:  Chronic, worsening and severe bilateral lower back pain radiating to hips and down legs. Patient continues to have severe pain despite good conservative therapies such as home exercise regimen, rest and use of medications. Patients clinical presentation and exam do seem more radicular in nature, however his pain does not follow specific dermatomal pattern. There is a concern his symptoms are most consistent with claudication. Prior lumbar MRI imaging from 2021 shows significant facet arthropathy at L3-L4 and L4-L5. There is also mild to moderate central canal stenosis at L3-L4. I discussed treatment plan with patient in detail today. Next step is to obtain new lumbar MRI imaging. Depending on results of MRI imaging we discussed possibility of performing lumbar epidural steroid injection. Patient has no questions at this time. No red flag symptoms noted upon exam today.     Meds & Orders: No orders of the defined types were placed in this encounter.   Orders Placed This Encounter  Procedures   MR LUMBAR SPINE WO CONTRAST    Follow-up: Return for Lumbar MRI review.   Procedures: No procedures performed      Clinical History: CLINICAL DATA:  Bilateral lower extremity radiculopathy. Worsening pain.   EXAM: MRI LUMBAR SPINE WITHOUT CONTRAST   TECHNIQUE: Multiplanar, multisequence MR imaging of the lumbar spine was performed. No intravenous contrast was administered.   COMPARISON:  06/14/2015   FINDINGS: Segmentation:  Standard.   Alignment: Minimal grade 1 anterolisthesis of L3 on L4 secondary to facet disease.   Vertebrae:  No fracture, evidence of discitis, or bone lesion.   Conus medullaris and cauda equina: Conus extends to the L1 level. Conus and cauda equina appear normal.   Paraspinal and other soft tissues: No acute paraspinal abnormality. Small left renal cyst.   Disc  levels:   Disc spaces: Degenerative disease with disc height loss at L5-S1. Disc desiccation at L2-3, L3-4 and L4-5.   T11-12: Minimal broad-based disc bulge. No central canal stenosis. Bilateral mild facet arthropathy. Mild left foraminal stenosis. No right foraminal stenosis.   T12-L1: No significant disc bulge. No evidence of neural foraminal stenosis. No central canal stenosis.   L1-L2: Mild broad-based disc bulge. No evidence of neural foraminal stenosis. No central canal stenosis.   L2-L3: Broad-based disc bulge eccentric towards the right. Severe right and moderate left facet arthropathy. Bilateral subarticular recess narrowing. No evidence of neural foraminal stenosis. Mild spinal stenosis.   L3-L4: Mild broad-based disc bulge. Severe bilateral facet arthropathy with bilateral facet effusions. Mild-moderate spinal stenosis. Bilateral subarticular recess stenosis. Mild right and moderate left foraminal stenosis.   L4-L5: Broad-based disc bulge. Moderate bilateral facet arthropathy with bilateral facet effusions. Mild right foraminal stenosis. No left foraminal stenosis. No central canal stenosis.   L5-S1: Broad-based disc osteophyte complex. Mild left foraminal stenosis. No right foraminal stenosis. No central canal stenosis.   IMPRESSION: 1. Diffuse lumbar spine spondylosis as described above. 2. No acute osseous injury of the lumbar spine.     Electronically Signed   By: Elige Ko   On: 09/22/2019 08:59   He reports that he quit smoking about 47 years ago. His smoking use included cigarettes. He started smoking about 77 years ago. He has a 30 pack-year smoking history. He has never used smokeless tobacco. No results for input(s): "HGBA1C", "LABURIC" in the last 8760 hours.  Objective:  VS:  HT:    WT:   BMI:     BP:   HR: bpm  TEMP: ( )  RESP:  Physical Exam Vitals and nursing note reviewed.  HENT:     Head: Normocephalic and atraumatic.     Right  Ear: External ear normal.     Left Ear: External ear normal.     Nose: Nose normal.     Mouth/Throat:     Mouth: Mucous membranes are moist.  Eyes:     Extraocular Movements: Extraocular movements intact.  Cardiovascular:     Rate and Rhythm: Normal rate.     Pulses: Normal pulses.  Pulmonary:     Effort: Pulmonary effort is normal.  Abdominal:     General: Abdomen is flat. There is no distension.  Musculoskeletal:        General: Tenderness present.     Cervical back: Normal range of motion.     Comments: Patient is slow to rise from seated position to standing. Good lumbar range of motion. No pain noted with facet loading. 5/5 strength noted with bilateral hip flexion, knee flexion/extension, ankle dorsiflexion/plantarflexion and EHL. No clonus noted bilaterally. No pain upon palpation of greater trochanters. No pain with internal/external rotation of bilateral hips. Sensation intact bilaterally. Negative slump test bilaterally. Ambulates with cane, gait slow and unsteady.  Skin:    General: Skin is warm and dry.     Capillary Refill: Capillary refill takes less than 2 seconds.  Neurological:     Mental Status: He is  alert and oriented to person, place, and time.     Gait: Gait abnormal.  Psychiatric:        Mood and Affect: Mood normal.        Behavior: Behavior normal.     Ortho Exam  Imaging: No results found.  Past Medical/Family/Surgical/Social History: Medications & Allergies reviewed per EMR, new medications updated. Patient Active Problem List   Diagnosis Date Noted   Bilateral primary osteoarthritis of knee 09/25/2019   Anxiety with depression 03/09/2018   Chronic pain 03/09/2018   Normocytic anemia 08/09/2017   BPPV (benign paroxysmal positional vertigo) 12/15/2015   Peripheral polyneuropathy 09/24/2015   Fatigue 09/23/2010   Abnormal ECG 09/23/2010   Hypertension 09/23/2010   Hypercholesterolemia 09/23/2010   ANXIETY 12/01/2009   GASTRITIS 12/01/2009    ABDOMINAL WALL PAIN 12/01/2009   GERD 10/31/2009   DIVERTICULOSIS-COLON 10/31/2009   DEGENERATIVE JOINT DISEASE 10/31/2009   DIARRHEA 10/31/2009   Past Medical History:  Diagnosis Date   Chronic pain    Depression    GAD (generalized anxiety disorder)    GERD (gastroesophageal reflux disease)    Hyperlipidemia    Hypertension    Low back pain radiating to left lower extremity    Low back pain radiating to right lower extremity    Osteoarthritis    Peripheral polyneuropathy    Family History  Problem Relation Age of Onset   Heart attack Brother 72       CABG x5   Coronary artery disease Brother    Heart attack Brother 22       stent   Past Surgical History:  Procedure Laterality Date   CERVICAL FUSION     DECUBITUS ULCER EXCISION     EXTERNAL EAR SURGERY     HEMORRHOID SURGERY     KNEE SURGERY Right    NOSE SURGERY     REVERSE SHOULDER ARTHROPLASTY Right 01/25/2020   Procedure: REVERSE SHOULDER ARTHROPLASTY;  Surgeon: Beverely Low, MD;  Location: [facility];  Service: Orthopedics;  Laterality: Right;   ROTATOR CUFF REPAIR     Social History   Occupational History   Occupation: tool and Scientist, forensic- Training and development officer    Comment: retired  Tobacco Use   Smoking status: Former    Current packs/day: 0.00    Average packs/day: 1 pack/day for 30.0 years (30.0 ttl pk-yrs)    Types: Cigarettes    Start date: 09/22/1945    Quit date: 09/23/1975    Years since quitting: 47.8   Smokeless tobacco: Never  Vaping Use   Vaping status: Never Used  Substance and Sexual Activity   Alcohol use: No   Drug use: No   Sexual activity: Not on file

## 2023-07-13 NOTE — Progress Notes (Signed)
 Pain Scale   Average Pain 8 Patient advises he has bilateral hip pain and he fell aprox. 3 weeks ago        +Driver, -BT, -Dye Allergies.

## 2023-07-15 ENCOUNTER — Telehealth (INDEPENDENT_AMBULATORY_CARE_PROVIDER_SITE_OTHER): Payer: Self-pay | Admitting: Otolaryngology

## 2023-07-15 NOTE — Telephone Encounter (Signed)
 LVM to confirm appt & location [id number] afm

## 2023-07-18 ENCOUNTER — Encounter (INDEPENDENT_AMBULATORY_CARE_PROVIDER_SITE_OTHER): Payer: Self-pay

## 2023-07-18 ENCOUNTER — Ambulatory Visit (INDEPENDENT_AMBULATORY_CARE_PROVIDER_SITE_OTHER): Payer: Medicare Other | Admitting: Otolaryngology

## 2023-07-18 VITALS — BP 153/83 | HR 82 | Ht 75.0 in | Wt 245.0 lb

## 2023-07-18 DIAGNOSIS — H6691 Otitis media, unspecified, right ear: Secondary | ICD-10-CM

## 2023-07-18 DIAGNOSIS — H60331 Swimmer's ear, right ear: Secondary | ICD-10-CM

## 2023-07-18 MED ORDER — CIPROFLOXACIN-DEXAMETHASONE 0.3-0.1 % OT SUSP: 4.0000 [drp] | Freq: Two times a day (BID) | OTIC | 8 refills | Status: AC

## 2023-07-19 DIAGNOSIS — H6061 Unspecified chronic otitis externa, right ear: Secondary | ICD-10-CM | POA: Insufficient documentation

## 2023-07-19 NOTE — Progress Notes (Signed)
 Patient ID: [name], male   DOB: 1935/06/04, 88 y.o.   MRN: [id number]  CC: Chronic right ear drainage  HPI:  [name] is a 88 y.o. male who presents today complaining of chronic right ear infections and right ear drainage.  According to the patient, he underwent right ear surgery 60+ years ago.  The surgery was likely a tympanomastoidectomy surgery.  According to the patient, he has been experiencing intermittent right ear drainage for 30+ years.  He also complains of bilateral hearing loss, worse on the right side.  He currently wears bilateral hearing aids.  Currently he is not on any otologic medication.  He denies any otalgia or vertigo.  Past Medical History:  Diagnosis Date   Chronic pain    Depression    GAD (generalized anxiety disorder)    GERD (gastroesophageal reflux disease)    Hyperlipidemia    Hypertension    Low back pain radiating to left lower extremity    Low back pain radiating to right lower extremity    Osteoarthritis    Peripheral polyneuropathy     Past Surgical History:  Procedure Laterality Date   CERVICAL FUSION     DECUBITUS ULCER EXCISION     EXTERNAL EAR SURGERY     HEMORRHOID SURGERY     KNEE SURGERY Right    NOSE SURGERY     REVERSE SHOULDER ARTHROPLASTY Right 01/25/2020   Procedure: REVERSE SHOULDER ARTHROPLASTY;  Surgeon: Beverely Low, MD;  Location: [facility];  Service: Orthopedics;  Laterality: Right;   ROTATOR CUFF REPAIR      Family History  Problem Relation Age of Onset   Heart attack Brother 75       CABG x5   Coronary artery disease Brother    Heart attack Brother 70       stent    Social History:  reports that he quit smoking about 47 years ago. His smoking use included cigarettes. He started smoking about 77 years ago. He has a 30 pack-year smoking history. He has never used smokeless tobacco. He reports that he does not drink alcohol and does not use drugs.  Allergies:  Allergies  Allergen Reactions   Prednisone Other  (See Comments)    Severe restlessness   Niaspan [Niacin Er (Antihyperlipidemic)]     Restlessness   Cephalexin Anxiety   Cymbalta [Duloxetine Hcl] Other (See Comments)    constipation    Prior to Admission medications   Medication Sig Start Date End Date Taking? Authorizing Provider  chlorthalidone (HYGROTON) 25 MG tablet Take 25 mg by mouth daily. 08/11/16  Yes [provider]  ciprofloxacin-dexamethasone (CIPRODEX) OTIC suspension Place 4 drops into both ears 2 (two) times daily for 10 days. 07/18/23 07/28/23 Yes Newman Pies, MD  diazepam (VALIUM) 5 MG tablet Take 2.5 mg by mouth in the morning and at bedtime.    Yes [provider]  ibuprofen (ADVIL) 200 MG tablet Take 400 mg by mouth in the morning and at bedtime.   Yes [provider]  traMADol (ULTRAM) 50 MG tablet 1 to 2 tablets every 6 hours as needed for pain control. 09/15/22  Yes Arby Barrette, MD  traZODone (DESYREL) 50 MG tablet Take 50 mg by mouth at bedtime.  07/30/19  Yes [provider]  gabapentin (NEURONTIN) 300 MG capsule Take 1 capsule (300 mg total) by mouth 3 (three) times daily. Patient not taking: Reported on 07/18/2023 04/25/23   Juanda Chance, NP  methocarbamol Nicholaus Corolla)  500 MG tablet Take 1 tablet (500 mg total) by mouth every 8 (eight) hours as needed for muscle spasms. Patient not taking: Reported on 07/18/2023 01/25/20   Beverely Low, MD  sertraline (ZOLOFT) 50 MG tablet Take 50 mg by mouth at bedtime. Patient not taking: Reported on 07/18/2023 08/05/19   [provider]    Blood pressure (!) 153/83, pulse 82, height 6\' 3"  (1.905 m), weight 245 lb (111.1 kg), SpO2 95%. Exam: General: Communicates without difficulty, well nourished, no acute distress. Head: Normocephalic, no evidence injury, no tenderness, facial buttresses intact without stepoff. Face/sinus: No tenderness to palpation and percussion. Facial movement is normal and symmetric. Eyes: PERRL, EOMI. No scleral  icterus, conjunctivae clear. Neuro: CN II exam reveals vision grossly intact.  No nystagmus at any point of gaze. Ears: Auricles well formed without lesions.  Slight drainage is noted within the right ear canal.  Under the operative microscope, the right ear canal is debrided with a suction catheter.  The right tympanic membrane is intact but mildly inflamed.  The left ear canal and tympanic membrane are normal.  Nose: External evaluation reveals normal support and skin without lesions.  Dorsum is intact.  Anterior rhinoscopy reveals congested mucosa over anterior aspect of inferior turbinates and intact septum.  No purulence noted. Oral:  Oral cavity and oropharynx are intact, symmetric, without erythema or edema.  Mucosa is moist without lesions. Neck: Full range of motion without pain.  There is no significant lymphadenopathy.  No masses palpable.  Thyroid bed within normal limits to palpation.  Parotid glands and submandibular glands equal bilaterally without mass.  Trachea is midline. Neuro:  CN 2-12 grossly intact.   Assessment: 1.  Chronic right ear infection, with inflamed right tympanic membrane and serous drainage. 2.  The left ear canal and tympanic membrane are normal.  Plan: 1.  Otomicroscopy with debridement of the right ear. 2.  Ciprodex eardrops 4 drops right ear twice daily for 10 days. 3.  Dry ear precaution on the right side. 4.  The patient will return for reevaluation in 1 month.  Mehkai Gallo W Krystyne Tewksbury 07/19/2023, 12:16 PM

## 2023-07-25 ENCOUNTER — Ambulatory Visit
Admission: RE | Admit: 2023-07-25 | Discharge: 2023-07-25 | Disposition: A | Discharge status: Home / Self Care | Source: Ambulatory Visit | Attending: Physical Medicine and Rehabilitation | Admitting: Physical Medicine and Rehabilitation

## 2023-07-25 DIAGNOSIS — G8929 Other chronic pain: Secondary | ICD-10-CM

## 2023-07-25 DIAGNOSIS — M5416 Radiculopathy, lumbar region: Secondary | ICD-10-CM

## 2023-08-09 ENCOUNTER — Telehealth: Payer: Self-pay | Admitting: Physical Medicine and Rehabilitation

## 2023-08-09 NOTE — Telephone Encounter (Signed)
 Patient called and wants to know about the result for his MRI. CB#[phone] He said leave a message because he in the hospital

## 2023-08-18 ENCOUNTER — Ambulatory Visit (INDEPENDENT_AMBULATORY_CARE_PROVIDER_SITE_OTHER): Admitting: Physical Medicine and Rehabilitation

## 2023-08-18 ENCOUNTER — Encounter: Payer: Self-pay | Admitting: Physical Medicine and Rehabilitation

## 2023-08-18 DIAGNOSIS — M47816 Spondylosis without myelopathy or radiculopathy, lumbar region: Secondary | ICD-10-CM

## 2023-08-18 DIAGNOSIS — M5116 Intervertebral disc disorders with radiculopathy, lumbar region: Secondary | ICD-10-CM

## 2023-08-18 DIAGNOSIS — R269 Unspecified abnormalities of gait and mobility: Secondary | ICD-10-CM | POA: Diagnosis not present

## 2023-08-18 DIAGNOSIS — M5416 Radiculopathy, lumbar region: Secondary | ICD-10-CM

## 2023-08-18 NOTE — Progress Notes (Signed)
 Pain Scale   Average Pain 9 Patient advising he lower back and bilateral hip pain is constant , patient advising that pain eases at night in his back, but bilateral legs begin to tingle and become numb.        +Driver, -BT, -Dye Allergies.

## 2023-08-18 NOTE — Progress Notes (Signed)
 [name] - 88 y.o. male MRN [id number]  Date of birth: 1935-08-13  Office Visit Note: Visit Date: 08/18/2023 PCP: Audria Leather, MD Referred by: Audria Leather, MD  Subjective: Chief Complaint  Patient presents with   Lower Back - Pain   HPI: [name] is a 88 y.o. male who comes in today for evaluation of chronic, worsening and severe bilateral lower back pain radiating down both legs. Lower back pain seems to be biggest pain generator. Pain ongoing for several years, states his pain is constant. His pain becomes severe with standing and walking. He describes pain as sore, aching and numbness sensation. He reports sharp sensation to lower back and constant tingling to bilateral legs. Recent lumbar MRI imaging shows bilateral facet arthropathy and anterolisthesis of L3-L4 and L4-L5. There is also bilateral lateral recess stenosis at both of these levels. There is a new left anterior to lateral disc herniation at L4-L5 that could affect left L4 nerve root. No high grade spinal canal stenosis noted. He underwent bilateral L3-L4 and L4-L5 radiofrequency ablation in our office on 05/26/2023. No relief of pain with this procedure. Prior radiofrequency ablation in 2023 did seem to help alleviate his pain for over a year. History of lumbar epidural steroid injection with Dr. Claudetta Cuba at [facility] in 2017, good relief of pain with these injections. Patient does have history of balance issues and frequent falls. Patient currently using cane to assist with ambulation. No focal weakness, no recent falls or trauma.      Review of Systems  Musculoskeletal:  Positive for back pain.  Neurological:  Positive for tingling. Negative for focal weakness and weakness.  All other systems reviewed and are negative.  Otherwise per HPI.  Assessment & Plan: Visit Diagnoses:    ICD-10-CM   1. Lumbar radiculopathy  M54.16 Ambulatory referral to Physical Medicine Rehab    2.  Intervertebral disc disorders with radiculopathy, lumbar region  M51.16 Ambulatory referral to Physical Medicine Rehab    3. Facet arthropathy, lumbar  M47.816 Ambulatory referral to Physical Medicine Rehab    4. Gait abnormality  R26.9 Ambulatory referral to Physical Medicine Rehab       Plan: Findings:  Chronic, worsening and severe bilateral lower back pain radiating down both legs. Patient continues to have severe pain despite good conservative therapies such as home exercise regimen, rest and use of medications. I discussed recent lumbar MRI with him today using imaging. Patients clinical presentation and exam are complex, differentials include lumbar radiculopathy and facet mediated pain. Recent lumbar MRI imaging does show findings that could cause both radicular and facet mediated pain. There is a new disc herniation at L4-L5 that could certainly be a pain generator. We discussed treatment plan in detail today. Next step is to perform diagnostic and hopefully therapeutic L4-L5 interlaminar epidural steroid injection under fluoroscopic guidance. He is not currently taking anticoagulant medication. I discussed injection procedure with him in detail, he has no questions at this time. He can continue with Tylenol  500 mg TID. No red flag symptoms noted upon exam today.     Meds & Orders: No orders of the defined types were placed in this encounter.   Orders Placed This Encounter  Procedures   Ambulatory referral to Physical Medicine Rehab    Follow-up: Return for L4-L5 interlaminar epidural steroid injection.   Procedures: No procedures performed      Clinical History: Narrative & Impression CLINICAL DATA:  Low back pain, symptoms  persist with greater than 6 weeks of treatment. Bilateral sciatic symptoms.   EXAM: MRI LUMBAR SPINE WITHOUT CONTRAST   TECHNIQUE: Multiplanar, multisequence MR imaging of the lumbar spine was performed. No intravenous contrast was administered.    COMPARISON:  09/21/2019   FINDINGS: Segmentation:  5 lumbar type vertebral bodies.   Alignment: Scoliotic curvature convex to the left. 2 mm degenerative anterolisthesis L3-4 and L4-5.   Vertebrae: No fracture or focal bone lesion. Some edematous endplate marrow changes on the left at L4-5 could contribute to back pain. Chronic fusion across the L5-S1 disc level.   Conus medullaris and cauda equina: Conus extends to the L1 level. Conus and cauda equina appear normal.   Paraspinal and other soft tissues: Negative   Disc levels:   T11-12 through L1-2: No significant finding.   L2-3: Desiccation and moderate bulging of the disc. Facet and ligamentous hypertrophy. Mild stenosis of both lateral recesses but no visible neural compression. Similar appearance to the study of 2021.   L3-4: Bilateral facet degeneration and hypertrophy. 2 mm of anterolisthesis. Bulging of the disc. Stenosis of both lateral recesses that could cause neural compression on either or both sides. Slight worsening since 2021.   L4-5: Bilateral facet arthropathy with degenerative anterolisthesis of 2 mm. Bulging of the disc. Mild stenosis of both lateral recesses but without definite L5 nerve compression. There is a left anterior to lateral disc herniation which was not present at the time of the prior examination. This is likely to be painful. As noted above, there is discogenic endplate marrow edema associated with this. This could cause not only back pain, but the left L4 nerve could be affected in an extraforaminal location.   L5-S1: Chronic fusion across this disc space. Wide patency of the canal and foramina.   IMPRESSION: 1. L3-4: Facet arthropathy. 2 mm of anterolisthesis. Bulging of the disc. Stenosis of both lateral recesses that could cause neural compression on either or both sides. Slight worsening since 2021.   2. L4-5: Facet arthropathy. 2 mm of anterolisthesis. Bulging of the disc.  Mild stenosis of both lateral recesses but without definite L5 nerve compression. There is a left anterior to lateral disc herniation which was not present at the time of the prior examination. This is likely to be painful. This could cause not only back pain, but the left L4 nerve could be affected in an extraforaminal location.   3. L5-S1: Chronic fusion across the disc space. Wide patency of the canal and foramina.     Electronically Signed   By: Bettylou Brunner M.D.   On: 08/09/2023 15:55   He reports that he quit smoking about 47 years ago. His smoking use included cigarettes. He started smoking about 77 years ago. He has a 30 pack-year smoking history. He has never used smokeless tobacco. No results for input(s): "HGBA1C", "LABURIC" in the last 8760 hours.  Objective:  VS:  HT:    WT:   BMI:     BP:   HR: bpm  TEMP: ( )  RESP:  Physical Exam Vitals and nursing note reviewed.  HENT:     Head: Normocephalic and atraumatic.     Right Ear: External ear normal.     Left Ear: External ear normal.     Nose: Nose normal.     Mouth/Throat:     Mouth: Mucous membranes are moist.  Eyes:     Extraocular Movements: Extraocular movements intact.  Cardiovascular:     Rate and  Rhythm: Normal rate.     Pulses: Normal pulses.  Pulmonary:     Effort: Pulmonary effort is normal.  Abdominal:     General: Abdomen is flat. There is no distension.  Musculoskeletal:        General: Tenderness present.     Cervical back: Normal range of motion.     Comments: Patient is slow to rise from seated position to standing. Good lumbar range of motion. No pain noted with facet loading. 5/5 strength noted with bilateral hip flexion, knee flexion/extension, ankle dorsiflexion/plantarflexion and EHL. No clonus noted bilaterally. No pain upon palpation of greater trochanters. No pain with internal/external rotation of bilateral hips. Sensation intact bilaterally. Negative slump test bilaterally. Ambulates  without with cane, gait unsteady.     Skin:    General: Skin is warm and dry.     Capillary Refill: Capillary refill takes less than 2 seconds.  Neurological:     Mental Status: He is alert and oriented to person, place, and time.     Gait: Gait abnormal.  Psychiatric:        Mood and Affect: Mood normal.        Behavior: Behavior normal.     Ortho Exam  Imaging: No results found.  Past Medical/Family/Surgical/Social History: Medications & Allergies reviewed per EMR, new medications updated. Patient Active Problem List   Diagnosis Date Noted   Chronic otitis externa of right ear 07/19/2023   Bilateral primary osteoarthritis of knee 09/25/2019   Anxiety with depression 03/09/2018   Chronic pain 03/09/2018   Normocytic anemia 08/09/2017   BPPV (benign paroxysmal positional vertigo) 12/15/2015   Peripheral polyneuropathy 09/24/2015   Fatigue 09/23/2010   Abnormal ECG 09/23/2010   Hypertension 09/23/2010   Hypercholesterolemia 09/23/2010   ANXIETY 12/01/2009   GASTRITIS 12/01/2009   ABDOMINAL WALL PAIN 12/01/2009   GERD 10/31/2009   DIVERTICULOSIS-COLON 10/31/2009   DEGENERATIVE JOINT DISEASE 10/31/2009   DIARRHEA 10/31/2009   Past Medical History:  Diagnosis Date   Chronic pain    Depression    GAD (generalized anxiety disorder)    GERD (gastroesophageal reflux disease)    Hyperlipidemia    Hypertension    Low back pain radiating to left lower extremity    Low back pain radiating to right lower extremity    Osteoarthritis    Peripheral polyneuropathy    Family History  Problem Relation Age of Onset   Heart attack Brother 62       CABG x5   Coronary artery disease Brother    Heart attack Brother 46       stent   Past Surgical History:  Procedure Laterality Date   CERVICAL FUSION     DECUBITUS ULCER EXCISION     EXTERNAL EAR SURGERY     HEMORRHOID SURGERY     KNEE SURGERY Right    NOSE SURGERY     REVERSE SHOULDER ARTHROPLASTY Right 01/25/2020    Procedure: REVERSE SHOULDER ARTHROPLASTY;  Surgeon: Winston Hawking, MD;  Location: [facility];  Service: Orthopedics;  Laterality: Right;   ROTATOR CUFF REPAIR     Social History   Occupational History   Occupation: tool and Scientist, forensic- Training and development officer    Comment: retired  Tobacco Use   Smoking status: Former    Current packs/day: 0.00    Average packs/day: 1 pack/day for 30.0 years (30.0 ttl pk-yrs)    Types: Cigarettes    Start date: 09/22/1945    Quit date: 09/23/1975    Years since  quitting: 47.9   Smokeless tobacco: Never  Vaping Use   Vaping status: Never Used  Substance and Sexual Activity   Alcohol use: No   Drug use: No   Sexual activity: Not on file

## 2023-08-24 ENCOUNTER — Ambulatory Visit (INDEPENDENT_AMBULATORY_CARE_PROVIDER_SITE_OTHER)

## 2023-09-20 ENCOUNTER — Encounter: Payer: Self-pay | Admitting: Podiatry

## 2023-09-20 ENCOUNTER — Ambulatory Visit (INDEPENDENT_AMBULATORY_CARE_PROVIDER_SITE_OTHER): Admitting: Podiatry

## 2023-09-20 DIAGNOSIS — M79671 Pain in right foot: Secondary | ICD-10-CM

## 2023-09-20 DIAGNOSIS — B351 Tinea unguium: Secondary | ICD-10-CM

## 2023-09-20 DIAGNOSIS — M79672 Pain in left foot: Secondary | ICD-10-CM | POA: Diagnosis not present

## 2023-09-20 NOTE — Progress Notes (Signed)
 Patient presents for evaluation and treatment of tenderness and some redness around nails feet.  Tenderness around toes with walking and wearing shoes.  Physical exam:  General appearance: Alert, pleasant, and in no acute distress.  Vascular: Pedal pulses: DP palpable bilaterally, PT palpable bilaterally.  Mild edema lower legs bilaterally  Neurological:  No paresthesias or burning noted.  Dermatologic:  Nails thickened, disfigured, discolored 1-5 BL with subungual debris.  Redness and hypertrophic nail folds along nail folds bilaterally but no signs of drainage or infection.  Musculoskeletal:     Diagnosis: 1. Painful onychomycotic nails 1 through 5 bilaterally. 2. Pain toes 1 through 5 bilaterally.  Plan: Debrided onychomycotic nails 1 through 5 bilaterally.  Return 3 months

## 2023-09-26 ENCOUNTER — Ambulatory Visit (INDEPENDENT_AMBULATORY_CARE_PROVIDER_SITE_OTHER): Admitting: Physical Medicine and Rehabilitation

## 2023-09-26 ENCOUNTER — Other Ambulatory Visit: Payer: Self-pay

## 2023-09-26 VITALS — BP 125/80 | HR 101

## 2023-09-26 DIAGNOSIS — S76312A Strain of muscle, fascia and tendon of the posterior muscle group at thigh level, left thigh, initial encounter: Secondary | ICD-10-CM

## 2023-09-26 DIAGNOSIS — M5416 Radiculopathy, lumbar region: Secondary | ICD-10-CM

## 2023-09-26 MED ORDER — METHYLPREDNISOLONE ACETATE 40 MG/ML IJ SUSP
40.0000 mg | Freq: Once | INTRAMUSCULAR | Status: AC
Start: 2023-09-26 — End: 2023-09-26
  Administered 2023-09-26: 40 mg

## 2023-09-26 NOTE — Patient Instructions (Signed)

## 2023-09-26 NOTE — Procedures (Unsigned)
 Lumbar Epidural Steroid Injection - Interlaminar Approach with Fluoroscopic Guidance  Patient: [name]      Date of Birth: 06-Sep-1935 MRN: [id number] PCP: Audria Leather, MD      Visit Date: 09/26/2023   Universal Protocol:     Consent Given By: the patient  Position: PRONE  Additional Comments: Vital signs were monitored before and after the procedure. Patient was prepped and draped in the usual sterile fashion. The correct patient, procedure, and site was verified.   Injection Procedure Details:   Procedure diagnoses: Lumbar radiculopathy [M54.16]   Meds Administered:  Meds ordered this encounter  Medications   methylPREDNISolone  acetate (DEPO-MEDROL ) injection 40 mg     Laterality: Left  Location/Site:  L4-5  Needle: 3.5 in., 20 ga. Tuohy  Needle Placement: Paramedian epidural  Findings:   -Comments: Excellent flow of contrast into the epidural space.  Procedure Details: Using a paramedian approach from the side mentioned above, the region overlying the inferior lamina was localized under fluoroscopic visualization and the soft tissues overlying this structure were infiltrated with 4 ml. of 1% Lidocaine  without Epinephrine . The Tuohy needle was inserted into the epidural space using a paramedian approach.   The epidural space was localized using loss of resistance along with counter oblique bi-planar fluoroscopic views.  After negative aspirate for air, blood, and CSF, a 2 ml. volume of Isovue -250 was injected into the epidural space and the flow of contrast was observed. Radiographs were obtained for documentation purposes.    The injectate was administered into the level noted above.   Additional Comments:  The patient tolerated the procedure well Dressing: 2 x 2 sterile gauze and Band-Aid    Post-procedure details: Patient was observed during the procedure. Post-procedure instructions were reviewed.  Patient left the clinic in stable condition.

## 2023-09-26 NOTE — Progress Notes (Unsigned)
 Pain Scale   Average Pain 10 Patient advising he has chronic lower back pain radiating to left leg.        +Driver, -BT, -Dye Allergies.

## 2023-09-26 NOTE — Progress Notes (Unsigned)
 [name] - 88 y.o. male MRN [id number]  Date of birth: 29-Dec-1935  Office Visit Note: Visit Date: 09/26/2023 PCP: Audria Leather, MD Referred by: Audria Leather, MD  Subjective: Chief Complaint  Patient presents with   Lower Back - Pain   HPI:  [name] is a 88 y.o. male who comes in today at the request of Elvan Hamel, FNP for planned Left L4-5 Lumbar Interlaminar epidural steroid injection with fluoroscopic guidance.  The patient has failed conservative care including home exercise, medications, time and activity modification.  This injection will be diagnostic and hopefully therapeutic.  Please see requesting physician notes for further details and justification.  He reports since we saw him at the end of April that he had an episode where he turned funny and was having a lot of pain in his left hamstring and he felt like maybe he had pulled a muscle.  This has been ongoing now for just a few weeks.  He does not feel like it is similar to his spine problem.  No paresthesias in that area.  Brief history is that he has had both knees replaced and typically has seen Dr. Dorthy Gavia in the [place].  He reports a hard time getting into see them at times.  I told him I would place a referral into Dr. Rozelle Corning in our office to look at him from a orthopedic standpoint in terms of this left hamstring.  ROS Otherwise per HPI.  Assessment & Plan: Visit Diagnoses:    ICD-10-CM   1. Lumbar radiculopathy  M54.16 XR C-ARM NO REPORT    Epidural Steroid injection    methylPREDNISolone  acetate (DEPO-MEDROL ) injection 40 mg      Plan: No additional findings.   Meds & Orders:  Meds ordered this encounter  Medications   methylPREDNISolone  acetate (DEPO-MEDROL ) injection 40 mg    Orders Placed This Encounter  Procedures   XR C-ARM NO REPORT   Epidural Steroid injection    Follow-up: Return for visit to requesting provider as needed.   Procedures: No procedures performed   Lumbar Epidural Steroid Injection - Interlaminar Approach with Fluoroscopic Guidance  Patient: [name]      Date of Birth: June 21, 1935 MRN: [id number] PCP: Audria Leather, MD      Visit Date: 09/26/2023   Universal Protocol:     Consent Given By: the patient  Position: PRONE  Additional Comments: Vital signs were monitored before and after the procedure. Patient was prepped and draped in the usual sterile fashion. The correct patient, procedure, and site was verified.   Injection Procedure Details:   Procedure diagnoses: Lumbar radiculopathy [M54.16]   Meds Administered:  Meds ordered this encounter  Medications   methylPREDNISolone  acetate (DEPO-MEDROL ) injection 40 mg     Laterality: Left  Location/Site:  L4-5  Needle: 3.5 in., 20 ga. Tuohy  Needle Placement: Paramedian epidural  Findings:   -Comments: Excellent flow of contrast into the epidural space.  Procedure Details: Using a paramedian approach from the side mentioned above, the region overlying the inferior lamina was localized under fluoroscopic visualization and the soft tissues overlying this structure were infiltrated with 4 ml. of 1% Lidocaine  without Epinephrine . The Tuohy needle was inserted into the epidural space using a paramedian approach.   The epidural space was localized using loss of resistance along with counter oblique bi-planar fluoroscopic views.  After negative aspirate for air, blood, and CSF, a 2 ml. volume of Isovue -250 was  injected into the epidural space and the flow of contrast was observed. Radiographs were obtained for documentation purposes.    The injectate was administered into the level noted above.   Additional Comments:  The patient tolerated the procedure well Dressing: 2 x 2 sterile gauze and Band-Aid    Post-procedure details: Patient was observed during the procedure. Post-procedure instructions were reviewed.  Patient left the clinic in stable condition.    Clinical History: Narrative & Impression CLINICAL DATA:  Low back pain, symptoms persist with greater than 6 weeks of treatment. Bilateral sciatic symptoms.   EXAM: MRI LUMBAR SPINE WITHOUT CONTRAST   TECHNIQUE: Multiplanar, multisequence MR imaging of the lumbar spine was performed. No intravenous contrast was administered.   COMPARISON:  09/21/2019   FINDINGS: Segmentation:  5 lumbar type vertebral bodies.   Alignment: Scoliotic curvature convex to the left. 2 mm degenerative anterolisthesis L3-4 and L4-5.   Vertebrae: No fracture or focal bone lesion. Some edematous endplate marrow changes on the left at L4-5 could contribute to back pain. Chronic fusion across the L5-S1 disc level.   Conus medullaris and cauda equina: Conus extends to the L1 level. Conus and cauda equina appear normal.   Paraspinal and other soft tissues: Negative   Disc levels:   T11-12 through L1-2: No significant finding.   L2-3: Desiccation and moderate bulging of the disc. Facet and ligamentous hypertrophy. Mild stenosis of both lateral recesses but no visible neural compression. Similar appearance to the study of 2021.   L3-4: Bilateral facet degeneration and hypertrophy. 2 mm of anterolisthesis. Bulging of the disc. Stenosis of both lateral recesses that could cause neural compression on either or both sides. Slight worsening since 2021.   L4-5: Bilateral facet arthropathy with degenerative anterolisthesis of 2 mm. Bulging of the disc. Mild stenosis of both lateral recesses but without definite L5 nerve compression. There is a left anterior to lateral disc herniation which was not present at the time of the prior examination. This is likely to be painful. As noted above, there is discogenic endplate marrow edema associated with this. This could cause not only back pain, but the left L4 nerve could be affected in an extraforaminal location.   L5-S1: Chronic fusion across this disc  space. Wide patency of the canal and foramina.   IMPRESSION: 1. L3-4: Facet arthropathy. 2 mm of anterolisthesis. Bulging of the disc. Stenosis of both lateral recesses that could cause neural compression on either or both sides. Slight worsening since 2021.   2. L4-5: Facet arthropathy. 2 mm of anterolisthesis. Bulging of the disc. Mild stenosis of both lateral recesses but without definite L5 nerve compression. There is a left anterior to lateral disc herniation which was not present at the time of the prior examination. This is likely to be painful. This could cause not only back pain, but the left L4 nerve could be affected in an extraforaminal location.   3. L5-S1: Chronic fusion across the disc space. Wide patency of the canal and foramina.     Electronically Signed   By: Bettylou Brunner M.D.   On: 08/09/2023 15:55     Objective:  VS:  HT:    WT:   BMI:     BP:125/80  HR:(!) 101bpm  TEMP: ( )  RESP:  Physical Exam Vitals and nursing note reviewed.  Constitutional:      General: He is not in acute distress.    Appearance: Normal appearance. He is not ill-appearing.  HENT:  Head: Normocephalic and atraumatic.     Right Ear: External ear normal.     Left Ear: External ear normal.     Nose: No congestion.  Eyes:     Extraocular Movements: Extraocular movements intact.  Cardiovascular:     Rate and Rhythm: Normal rate.     Pulses: Normal pulses.  Pulmonary:     Effort: Pulmonary effort is normal. No respiratory distress.  Abdominal:     General: There is no distension.     Palpations: Abdomen is soft.  Musculoskeletal:        General: No tenderness or signs of injury.     Cervical back: Neck supple.     Right lower leg: No edema.     Left lower leg: No edema.     Comments: Patient has good distal strength without clonus.  Skin:    Findings: No erythema or rash.  Neurological:     General: No focal deficit present.     Mental Status: He is alert and  oriented to person, place, and time.     Sensory: No sensory deficit.     Motor: No weakness or abnormal muscle tone.     Coordination: Coordination normal.  Psychiatric:        Mood and Affect: Mood normal.        Behavior: Behavior normal.     Imaging: No results found.

## 2023-10-12 ENCOUNTER — Ambulatory Visit (INDEPENDENT_AMBULATORY_CARE_PROVIDER_SITE_OTHER): Admitting: Orthopedic Surgery

## 2023-10-12 DIAGNOSIS — M5416 Radiculopathy, lumbar region: Secondary | ICD-10-CM | POA: Diagnosis not present

## 2023-10-15 ENCOUNTER — Encounter: Payer: Self-pay | Admitting: Orthopedic Surgery

## 2023-10-15 NOTE — Progress Notes (Signed)
 Office Visit Note   Patient: [name]           Date of Birth: 11-14-1935           MRN: [id number] Visit Date: 10/12/2023 Requested by: Eldonna Novel, MD [address] [place],  [place] [address] PCP: Leila Lucie LABOR, MD  Subjective: Chief Complaint  Patient presents with   Lower Back - Pain    HPI: [name] is a 88 y.o. male who presents to the office reporting back pain and leg symptoms.  Had an epidural steroid injection in June of this year.  Has fallen a few times since then.  Ambulates with a cane.  Does report some bilateral lower extremity weakness and giving way.  There are some concern about hamstring injury.  He does use a cane and Bing.  Hard for him to keep his balance.  He does not have stairs at home.  Does not take anticoagulants..                ROS: All systems reviewed are negative as they relate to the chief complaint within the history of present illness.  Patient denies fevers or chills.  Assessment & Plan: Visit Diagnoses: No diagnosis found.  Plan: Impression is pretty functional hamstring and quad strength bilaterally.  Quad tendon and hamstring attachments at the ischial tuberosities are palpable intact and functional.  Nothing really going on in the knees in terms of ligamentous instability.  He does likely have some arthritis in the knees.  No structural problem with the lower extremities outside of what is going on as a radiating symptom from his back.  He will follow-up with us  as needed.  Follow-Up Instructions: No follow-ups on file.   Orders:  No orders of the defined types were placed in this encounter.  No orders of the defined types were placed in this encounter.     Procedures: No procedures performed   Clinical Data: No additional findings.  Objective: Vital Signs: There were no vitals taken for this visit.  Physical Exam:  Constitutional: Patient appears well-developed HEENT:  Head: Normocephalic Eyes:EOM are  normal Neck: Normal range of motion Cardiovascular: Normal rate Pulmonary/chest: Effort normal Neurologic: Patient is alert Skin: Skin is warm Psychiatric: Patient has normal mood and affect  Ortho Exam: Ortho exam demonstrates 5 out of 5 ankle dorsiflexion plantarflexion quad and hamstring strength.  No groin pain with internal/external Tatian of the leg.  He has functional and palpable patellar and quad tendons on both sides as well as excellent knee flexion strength with palpable hamstring tendons on both sides of the ischial tuberosities.  Specialty Comments:  Narrative & Impression CLINICAL DATA:  Low back pain, symptoms persist with greater than 6 weeks of treatment. Bilateral sciatic symptoms.   EXAM: MRI LUMBAR SPINE WITHOUT CONTRAST   TECHNIQUE: Multiplanar, multisequence MR imaging of the lumbar spine was performed. No intravenous contrast was administered.   COMPARISON:  09/21/2019   FINDINGS: Segmentation:  5 lumbar type vertebral bodies.   Alignment: Scoliotic curvature convex to the left. 2 mm degenerative anterolisthesis L3-4 and L4-5.   Vertebrae: No fracture or focal bone lesion. Some edematous endplate marrow changes on the left at L4-5 could contribute to back pain. Chronic fusion across the L5-S1 disc level.   Conus medullaris and cauda equina: Conus extends to the L1 level. Conus and cauda equina appear normal.   Paraspinal and other soft tissues: Negative   Disc levels:   T11-12  through L1-2: No significant finding.   L2-3: Desiccation and moderate bulging of the disc. Facet and ligamentous hypertrophy. Mild stenosis of both lateral recesses but no visible neural compression. Similar appearance to the study of 2021.   L3-4: Bilateral facet degeneration and hypertrophy. 2 mm of anterolisthesis. Bulging of the disc. Stenosis of both lateral recesses that could cause neural compression on either or both sides. Slight worsening since 2021.    L4-5: Bilateral facet arthropathy with degenerative anterolisthesis of 2 mm. Bulging of the disc. Mild stenosis of both lateral recesses but without definite L5 nerve compression. There is a left anterior to lateral disc herniation which was not present at the time of the prior examination. This is likely to be painful. As noted above, there is discogenic endplate marrow edema associated with this. This could cause not only back pain, but the left L4 nerve could be affected in an extraforaminal location.   L5-S1: Chronic fusion across this disc space. Wide patency of the canal and foramina.   IMPRESSION: 1. L3-4: Facet arthropathy. 2 mm of anterolisthesis. Bulging of the disc. Stenosis of both lateral recesses that could cause neural compression on either or both sides. Slight worsening since 2021.   2. L4-5: Facet arthropathy. 2 mm of anterolisthesis. Bulging of the disc. Mild stenosis of both lateral recesses but without definite L5 nerve compression. There is a left anterior to lateral disc herniation which was not present at the time of the prior examination. This is likely to be painful. This could cause not only back pain, but the left L4 nerve could be affected in an extraforaminal location.   3. L5-S1: Chronic fusion across the disc space. Wide patency of the canal and foramina.     Electronically Signed   By: Oneil Officer M.D.   On: 08/09/2023 15:55  Imaging: No results found.   PMFS History: Patient Active Problem List   Diagnosis Date Noted   Chronic otitis externa of right ear 07/19/2023   Bilateral primary osteoarthritis of knee 09/25/2019   Anxiety with depression 03/09/2018   Chronic pain 03/09/2018   Normocytic anemia 08/09/2017   BPPV (benign paroxysmal positional vertigo) 12/15/2015   Peripheral polyneuropathy 09/24/2015   Fatigue 09/23/2010   Abnormal ECG 09/23/2010   Hypertension 09/23/2010   Hypercholesterolemia 09/23/2010   ANXIETY 12/01/2009    GASTRITIS 12/01/2009   ABDOMINAL WALL PAIN 12/01/2009   GERD 10/31/2009   DIVERTICULOSIS-COLON 10/31/2009   DEGENERATIVE JOINT DISEASE 10/31/2009   DIARRHEA 10/31/2009   Past Medical History:  Diagnosis Date   Chronic pain    Depression    GAD (generalized anxiety disorder)    GERD (gastroesophageal reflux disease)    Hyperlipidemia    Hypertension    Low back pain radiating to left lower extremity    Low back pain radiating to right lower extremity    Osteoarthritis    Peripheral polyneuropathy     Family History  Problem Relation Age of Onset   Heart attack Brother 61       CABG x5   Coronary artery disease Brother    Heart attack Brother 15       stent    Past Surgical History:  Procedure Laterality Date   CERVICAL FUSION     DECUBITUS ULCER EXCISION     EXTERNAL EAR SURGERY     HEMORRHOID SURGERY     KNEE SURGERY Right    NOSE SURGERY     REVERSE SHOULDER ARTHROPLASTY Right 01/25/2020  Procedure: REVERSE SHOULDER ARTHROPLASTY;  Surgeon: Kay Kemps, MD;  Location: [facility];  Service: Orthopedics;  Laterality: Right;   ROTATOR CUFF REPAIR     Social History   Occupational History   Occupation: tool and Scientist, forensic- Training and development officer    Comment: retired  Tobacco Use   Smoking status: Former    Current packs/day: 0.00    Average packs/day: 1 pack/day for 30.0 years (30.0 ttl pk-yrs)    Types: Cigarettes    Start date: 09/22/1945    Quit date: 09/23/1975    Years since quitting: 48.0   Smokeless tobacco: Never  Vaping Use   Vaping status: Never Used  Substance and Sexual Activity   Alcohol use: No   Drug use: No   Sexual activity: Not on file

## 2023-12-14 ENCOUNTER — Telehealth: Payer: Self-pay | Admitting: Physical Medicine and Rehabilitation

## 2023-12-14 NOTE — Telephone Encounter (Signed)
 Pt called requesting an appt for back injection. Please call pt at [phone]

## 2023-12-19 ENCOUNTER — Telehealth: Payer: Self-pay

## 2023-12-19 NOTE — Telephone Encounter (Signed)
 Emergency contact, Rieta, states Last injection helped a couple weeks at about 90 %. He then has a series (4) of minor falls. He has not followed up with PCP about this. Pain is in lower back and both hips. Current pain is a 10.

## 2023-12-21 ENCOUNTER — Ambulatory Visit (INDEPENDENT_AMBULATORY_CARE_PROVIDER_SITE_OTHER): Admitting: Podiatry

## 2023-12-21 ENCOUNTER — Encounter: Payer: Self-pay | Admitting: Podiatry

## 2023-12-21 DIAGNOSIS — B351 Tinea unguium: Secondary | ICD-10-CM

## 2023-12-21 DIAGNOSIS — M79672 Pain in left foot: Secondary | ICD-10-CM | POA: Diagnosis not present

## 2023-12-21 DIAGNOSIS — M79671 Pain in right foot: Secondary | ICD-10-CM | POA: Diagnosis not present

## 2023-12-21 NOTE — Progress Notes (Signed)
 Patient presents for evaluation and treatment of tenderness and some redness around nails feet.  Tenderness around toes with walking and wearing shoes.  Physical exam:  General appearance: Alert, pleasant, and in no acute distress.  Vascular: Pedal pulses: DP 2/4 B/L, PT 2/4 B/L. Mild edema lower legs bilaterally  Neurological:    Dermatologic:  Nails thickened, disfigured, discolored 1-5 BL with subungual debris.  Redness and hypertrophic nail folds along nail folds bilaterally but no signs of drainage or infection.  Skin thin and atrophic with no hair growth lower extremity bilaterally   Musculoskeletal:     Diagnosis: 1. Painful onychomycotic nails 1 through 5 bilaterally. 2. Pain toes 1 through 5 bilaterally.  Plan: -Debrided onychomycotic nails 1 through 5 bilaterally.  Sharply debrided nails with nail clipper with power bur.  Return 3 months RFC

## 2023-12-27 ENCOUNTER — Ambulatory Visit: Admitting: Physical Medicine and Rehabilitation

## 2024-01-05 ENCOUNTER — Ambulatory Visit: Admitting: Physical Medicine and Rehabilitation

## 2024-01-12 ENCOUNTER — Ambulatory Visit (INDEPENDENT_AMBULATORY_CARE_PROVIDER_SITE_OTHER): Admitting: Physical Medicine and Rehabilitation

## 2024-01-12 ENCOUNTER — Encounter: Payer: Self-pay | Admitting: Physical Medicine and Rehabilitation

## 2024-01-12 DIAGNOSIS — G8929 Other chronic pain: Secondary | ICD-10-CM | POA: Diagnosis not present

## 2024-01-12 DIAGNOSIS — M47819 Spondylosis without myelopathy or radiculopathy, site unspecified: Secondary | ICD-10-CM | POA: Diagnosis not present

## 2024-01-12 DIAGNOSIS — R269 Unspecified abnormalities of gait and mobility: Secondary | ICD-10-CM | POA: Diagnosis not present

## 2024-01-12 DIAGNOSIS — M545 Low back pain, unspecified: Secondary | ICD-10-CM | POA: Diagnosis not present

## 2024-01-12 NOTE — Progress Notes (Signed)
 [name] - 88 y.o. male MRN [id number]  Date of birth: May 08, 1935  Office Visit Note: Visit Date: 01/12/2024 PCP: Leila Lucie LABOR, MD Referred by: Leila Lucie LABOR, MD  Subjective: Chief Complaint  Patient presents with   Lower Back - Pain   HPI: [name] is a 88 y.o. male who comes in today for evaluation of chronic, worsening and severe bilateral lower back pain radiating to hips. Numbness and tingling to bilateral legs. Biggest pain generator is lower back. His pain worsens with standing. He describes pain as sore and numbness sensation, currently rates as 8 out of 10. He reports multiple falls at home due to leg numbness/tingling. States he fell in kitchen this morning, was able to assist himself back up with assistance. He does live at home with caregiver. Some relief of pain with home exercise regimen, rest and use of medications. History of formal physical therapy with some relief of pain. Recent lumbar MRI imaging shows bilateral facet arthropathy and anterolisthesis of L3-L4 and L4-L5. There is also bilateral lateral recess stenosis at both of these levels. There is a new left anterior to lateral disc herniation at L4-L5 that could affect left L4 nerve root. No high grade spinal canal stenosis noted. He underwent radiofrequency ablation in our office in January of 2025, no relief of pain with this procedure. More recently, he underwent left L4-L5 interlaminar epidural steroid injection on 09/26/2023. He reports greater than 50% relief of pain for over a month. He is currently using rolling Cirigliano to assist with ambulation.   He does have history of polyneuropathy, currently taking Gabapentin  600 mg TID prescribed by his primary care provider.      Review of Systems  Musculoskeletal:  Positive for back pain.  Neurological:  Positive for tingling and sensory change. Negative for focal weakness and weakness.  All other systems reviewed and are negative.  Otherwise per  HPI.  Assessment & Plan: Visit Diagnoses:    ICD-10-CM   1. Chronic bilateral low back pain without sciatica  M54.50 Ambulatory referral to Physical Medicine Rehab   G89.29 Ambulatory referral to Physical Therapy    2. Spondylosis without myelopathy or radiculopathy  M47.819 Ambulatory referral to Physical Medicine Rehab    Ambulatory referral to Physical Therapy    3. Gait abnormality  R26.9 Ambulatory referral to Physical Medicine Rehab    Ambulatory referral to Physical Therapy       Plan: Findings:  Chronic, worsening and severe bilateral lower back pain radiating to hips. Paresthesias to bilateral lower extremities. Patient continues to have severe pain despite good conservative therapies such as formal physical therapy, home exercise regimen, rest and use of medications. The majority of his pain stems from lower back/hips, this does seem to be more facet mediated and becomes severe with standing. There is facet arthropathy at the levels of L3-L4 and L4-L5. We discussed treatment plan in detail today. Next step is to perform diagnostic and hopefully therapeutic bilateral L3-L4 and L4-L5 facet joint injections under fluoroscopic guidance. If good relief of pain with injections we can repeat this procedure infrequently as needed. I explained to him that the paresthesia to bilateral legs is likely polyneuropathy, can talk with PCP about further management. No red flag symptoms noted upon exam today.   He is severely deconditioned, does spend most of his day sitting in recliner. I will arrange for in home physical therapy with a focus on strengthening, gait and posture.     Meds &  Orders: No orders of the defined types were placed in this encounter.   Orders Placed This Encounter  Procedures   Ambulatory referral to Physical Medicine Rehab   Ambulatory referral to Physical Therapy    Follow-up: Return for Bilateral L3-L4 and L4-L5 facet joint injections.   Procedures: No procedures  performed      Clinical History: Narrative & Impression CLINICAL DATA:  Low back pain, symptoms persist with greater than 6 weeks of treatment. Bilateral sciatic symptoms.   EXAM: MRI LUMBAR SPINE WITHOUT CONTRAST   TECHNIQUE: Multiplanar, multisequence MR imaging of the lumbar spine was performed. No intravenous contrast was administered.   COMPARISON:  09/21/2019   FINDINGS: Segmentation:  5 lumbar type vertebral bodies.   Alignment: Scoliotic curvature convex to the left. 2 mm degenerative anterolisthesis L3-4 and L4-5.   Vertebrae: No fracture or focal bone lesion. Some edematous endplate marrow changes on the left at L4-5 could contribute to back pain. Chronic fusion across the L5-S1 disc level.   Conus medullaris and cauda equina: Conus extends to the L1 level. Conus and cauda equina appear normal.   Paraspinal and other soft tissues: Negative   Disc levels:   T11-12 through L1-2: No significant finding.   L2-3: Desiccation and moderate bulging of the disc. Facet and ligamentous hypertrophy. Mild stenosis of both lateral recesses but no visible neural compression. Similar appearance to the study of 2021.   L3-4: Bilateral facet degeneration and hypertrophy. 2 mm of anterolisthesis. Bulging of the disc. Stenosis of both lateral recesses that could cause neural compression on either or both sides. Slight worsening since 2021.   L4-5: Bilateral facet arthropathy with degenerative anterolisthesis of 2 mm. Bulging of the disc. Mild stenosis of both lateral recesses but without definite L5 nerve compression. There is a left anterior to lateral disc herniation which was not present at the time of the prior examination. This is likely to be painful. As noted above, there is discogenic endplate marrow edema associated with this. This could cause not only back pain, but the left L4 nerve could be affected in an extraforaminal location.   L5-S1: Chronic fusion across  this disc space. Wide patency of the canal and foramina.   IMPRESSION: 1. L3-4: Facet arthropathy. 2 mm of anterolisthesis. Bulging of the disc. Stenosis of both lateral recesses that could cause neural compression on either or both sides. Slight worsening since 2021.   2. L4-5: Facet arthropathy. 2 mm of anterolisthesis. Bulging of the disc. Mild stenosis of both lateral recesses but without definite L5 nerve compression. There is a left anterior to lateral disc herniation which was not present at the time of the prior examination. This is likely to be painful. This could cause not only back pain, but the left L4 nerve could be affected in an extraforaminal location.   3. L5-S1: Chronic fusion across the disc space. Wide patency of the canal and foramina.     Electronically Signed   By: Oneil Officer M.D.   On: 08/09/2023 15:55   He reports that he quit smoking about 48 years ago. His smoking use included cigarettes. He started smoking about 78 years ago. He has a 30 pack-year smoking history. He has never used smokeless tobacco. No results for input(s): HGBA1C, LABURIC in the last 8760 hours.  Objective:  VS:  HT:    WT:   BMI:     BP:   HR: bpm  TEMP: ( )  RESP:  Physical Exam Vitals and  nursing note reviewed.  HENT:     Head: Normocephalic and atraumatic.     Right Ear: External ear normal.     Left Ear: External ear normal.     Nose: Nose normal.     Mouth/Throat:     Mouth: Mucous membranes are moist.  Eyes:     Extraocular Movements: Extraocular movements intact.  Cardiovascular:     Rate and Rhythm: Normal rate.     Pulses: Normal pulses.  Pulmonary:     Effort: Pulmonary effort is normal.  Abdominal:     General: Abdomen is flat. There is no distension.  Musculoskeletal:        General: Tenderness present.     Cervical back: Normal range of motion.     Comments: Patient is slow to rise from seated position to standing. Pain noted with facet loading  and lumbar extension. 5/5 strength noted with bilateral hip flexion, knee flexion/extension, ankle dorsiflexion/plantarflexion and EHL. No clonus noted bilaterally. No pain upon palpation of greater trochanters. No pain with internal/external rotation of bilateral hips. Sensation intact bilaterally. Negative slump test bilaterally. Ambulates with rolling [place], gait slow and unsteady.    Skin:    General: Skin is warm and dry.     Capillary Refill: Capillary refill takes less than 2 seconds.  Neurological:     Mental Status: He is alert and oriented to person, place, and time.     Gait: Gait abnormal.  Psychiatric:        Mood and Affect: Mood normal.        Behavior: Behavior normal.     Ortho Exam  Imaging: No results found.  Past Medical/Family/Surgical/Social History: Medications & Allergies reviewed per EMR, new medications updated. Patient Active Problem List   Diagnosis Date Noted   Chronic otitis externa of right ear 07/19/2023   Bilateral primary osteoarthritis of knee 09/25/2019   Anxiety with depression 03/09/2018   Chronic pain 03/09/2018   Normocytic anemia 08/09/2017   BPPV (benign paroxysmal positional vertigo) 12/15/2015   Peripheral polyneuropathy 09/24/2015   Fatigue 09/23/2010   Abnormal ECG 09/23/2010   Hypertension 09/23/2010   Hypercholesterolemia 09/23/2010   ANXIETY 12/01/2009   GASTRITIS 12/01/2009   ABDOMINAL WALL PAIN 12/01/2009   GERD 10/31/2009   DIVERTICULOSIS-COLON 10/31/2009   DEGENERATIVE JOINT DISEASE 10/31/2009   DIARRHEA 10/31/2009   Past Medical History:  Diagnosis Date   Chronic pain    Depression    GAD (generalized anxiety disorder)    GERD (gastroesophageal reflux disease)    Hyperlipidemia    Hypertension    Low back pain radiating to left lower extremity    Low back pain radiating to right lower extremity    Osteoarthritis    Peripheral polyneuropathy    Family History  Problem Relation Age of Onset   Heart attack  Brother 8       CABG x5   Coronary artery disease Brother    Heart attack Brother 26       stent   Past Surgical History:  Procedure Laterality Date   CERVICAL FUSION     DECUBITUS ULCER EXCISION     EXTERNAL EAR SURGERY     HEMORRHOID SURGERY     KNEE SURGERY Right    NOSE SURGERY     REVERSE SHOULDER ARTHROPLASTY Right 01/25/2020   Procedure: REVERSE SHOULDER ARTHROPLASTY;  Surgeon: Kay Kemps, MD;  Location: [facility];  Service: Orthopedics;  Laterality: Right;   ROTATOR CUFF REPAIR  Social History   Occupational History   Occupation: tool and Scientist, 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

## 2024-01-12 NOTE — Progress Notes (Signed)
 Pain Scale   Average Pain 10 Patient advised he has chronic lower back pain radiating bilaterally to legs without relief        +Driver, -BT, -Dye Allergies.

## 2024-01-18 ENCOUNTER — Telehealth: Payer: Self-pay | Admitting: *Deleted

## 2024-01-18 NOTE — Telephone Encounter (Signed)
 [facility] emailed requesting approval of verbal orders for HHPT frequency of: 1w1 2w3 1w5. Verbal auth given.

## 2024-02-07 ENCOUNTER — Ambulatory Visit (INDEPENDENT_AMBULATORY_CARE_PROVIDER_SITE_OTHER): Admitting: Physical Medicine and Rehabilitation

## 2024-02-07 ENCOUNTER — Other Ambulatory Visit: Payer: Self-pay

## 2024-02-07 VITALS — BP 152/87 | HR 75

## 2024-02-07 DIAGNOSIS — M47816 Spondylosis without myelopathy or radiculopathy, lumbar region: Secondary | ICD-10-CM

## 2024-02-07 MED ORDER — METHYLPREDNISOLONE ACETATE 80 MG/ML IJ SUSP
40.0000 mg | Freq: Once | INTRAMUSCULAR | Status: AC
Start: 2024-02-07 — End: 2024-02-07
  Administered 2024-02-07: 40 mg

## 2024-02-07 NOTE — Progress Notes (Signed)
 Pain Scale   Average Pain 10 Patient advising that patient advising he has lower back pain radiating bilaterally to legs. Pain is constant        +Driver, -BT, -Dye Allergies.

## 2024-02-07 NOTE — Procedures (Signed)
 Lumbar Facet Joint Intra-Articular Injection(s) with Fluoroscopic Guidance  Patient: [name]      Date of Birth: January 20, 1936 MRN: [id number] PCP: Leila Lucie LABOR, MD      Visit Date: 02/07/2024   Universal Protocol:    Date/Time: 02/07/2024  Consent Given By: the patient  Position: PRONE   Additional Comments: Vital signs were monitored before and after the procedure. Patient was prepped and draped in the usual sterile fashion. The correct patient, procedure, and site was verified.   Injection Procedure Details:  Procedure Site One Meds Administered:  Meds ordered this encounter  Medications   methylPREDNISolone  acetate (DEPO-MEDROL ) injection 40 mg     Laterality: Bilateral  Location/Site:  L3-L4 L4-L5  Needle size: 22 guage  Needle type: Spinal  Needle Placement: Articular  Findings:  -Comments: Excellent flow of contrast producing a partial arthrogram.  Procedure Details: The fluoroscope beam is vertically oriented in AP, and the inferior recess is visualized beneath the lower pole of the inferior apophyseal process, which represents the target point for needle insertion. When direct visualization is difficult the target point is located at the medial projection of the vertebral pedicle. The region overlying each aforementioned target is locally anesthetized with a 1 to 2 ml. volume of 1% Lidocaine  without Epinephrine .   The spinal needle was inserted into each of the above mentioned facet joints using biplanar fluoroscopic guidance. A 0.25 to 0.5 ml. volume of Isovue -250 was injected and a partial facet joint arthrogram was obtained. A single spot film was obtained of the resulting arthrogram.    One to 1.25 ml of the steroid/anesthetic solution was then injected into each of the facet joints noted above.   Additional Comments:  The patient tolerated the procedure well Dressing: 2 x 2 sterile gauze and Band-Aid    Post-procedure details: Patient was  observed during the procedure. Post-procedure instructions were reviewed.  Patient left the clinic in stable condition.

## 2024-02-07 NOTE — Progress Notes (Signed)
 [name] - 88 y.o. male MRN [id number]  Date of birth: 1935-05-16  Office Visit Note: Visit Date: 02/07/2024 PCP: Leila Lucie LABOR, MD Referred by: Leila Lucie LABOR, MD  Subjective: Chief Complaint  Patient presents with   Lower Back - Pain   HPI:  [name] is a 88 y.o. male who comes in today at the request of Duwaine Pouch, FNP for planned Bilateral  L3-4 and L4-5 Lumbar facet/medial branch block with fluoroscopic guidance.  The patient has failed conservative care including home exercise, medications, time and activity modification.  This injection will be diagnostic and hopefully therapeutic.  Please see requesting physician notes for further details and justification.  Exam has shown concordant pain with facet joint loading.   ROS Otherwise per HPI.  Assessment & Plan: Visit Diagnoses:    ICD-10-CM   1. Spondylosis without myelopathy or radiculopathy, lumbar region  M47.816 XR C-ARM NO REPORT    Facet Injection    methylPREDNISolone  acetate (DEPO-MEDROL ) injection 40 mg      Plan: No additional findings.   Meds & Orders:  Meds ordered this encounter  Medications   methylPREDNISolone  acetate (DEPO-MEDROL ) injection 40 mg    Orders Placed This Encounter  Procedures   Facet Injection   XR C-ARM NO REPORT    Follow-up: Return for visit to requesting provider as needed.   Procedures: No procedures performed  Lumbar Facet Joint Intra-Articular Injection(s) with Fluoroscopic Guidance  Patient: [name]      Date of Birth: 09-07-1935 MRN: [id number] PCP: Leila Lucie LABOR, MD      Visit Date: 02/07/2024   Universal Protocol:    Date/Time: 02/07/2024  Consent Given By: the patient  Position: PRONE   Additional Comments: Vital signs were monitored before and after the procedure. Patient was prepped and draped in the usual sterile fashion. The correct patient, procedure, and site was verified.   Injection Procedure Details:  Procedure Site  One Meds Administered:  Meds ordered this encounter  Medications   methylPREDNISolone  acetate (DEPO-MEDROL ) injection 40 mg     Laterality: Bilateral  Location/Site:  L3-L4 L4-L5  Needle size: 22 guage  Needle type: Spinal  Needle Placement: Articular  Findings:  -Comments: Excellent flow of contrast producing a partial arthrogram.  Procedure Details: The fluoroscope beam is vertically oriented in AP, and the inferior recess is visualized beneath the lower pole of the inferior apophyseal process, which represents the target point for needle insertion. When direct visualization is difficult the target point is located at the medial projection of the vertebral pedicle. The region overlying each aforementioned target is locally anesthetized with a 1 to 2 ml. volume of 1% Lidocaine  without Epinephrine .   The spinal needle was inserted into each of the above mentioned facet joints using biplanar fluoroscopic guidance. A 0.25 to 0.5 ml. volume of Isovue -250 was injected and a partial facet joint arthrogram was obtained. A single spot film was obtained of the resulting arthrogram.    One to 1.25 ml of the steroid/anesthetic solution was then injected into each of the facet joints noted above.   Additional Comments:  The patient tolerated the procedure well Dressing: 2 x 2 sterile gauze and Band-Aid    Post-procedure details: Patient was observed during the procedure. Post-procedure instructions were reviewed.  Patient left the clinic in stable condition.    Clinical History: Narrative & Impression CLINICAL DATA:  Low back pain, symptoms persist with greater than 6 weeks of treatment. Bilateral sciatic  symptoms.   EXAM: MRI LUMBAR SPINE WITHOUT CONTRAST   TECHNIQUE: Multiplanar, multisequence MR imaging of the lumbar spine was performed. No intravenous contrast was administered.   COMPARISON:  09/21/2019   FINDINGS: Segmentation:  5 lumbar type vertebral bodies.    Alignment: Scoliotic curvature convex to the left. 2 mm degenerative anterolisthesis L3-4 and L4-5.   Vertebrae: No fracture or focal bone lesion. Some edematous endplate marrow changes on the left at L4-5 could contribute to back pain. Chronic fusion across the L5-S1 disc level.   Conus medullaris and cauda equina: Conus extends to the L1 level. Conus and cauda equina appear normal.   Paraspinal and other soft tissues: Negative   Disc levels:   T11-12 through L1-2: No significant finding.   L2-3: Desiccation and moderate bulging of the disc. Facet and ligamentous hypertrophy. Mild stenosis of both lateral recesses but no visible neural compression. Similar appearance to the study of 2021.   L3-4: Bilateral facet degeneration and hypertrophy. 2 mm of anterolisthesis. Bulging of the disc. Stenosis of both lateral recesses that could cause neural compression on either or both sides. Slight worsening since 2021.   L4-5: Bilateral facet arthropathy with degenerative anterolisthesis of 2 mm. Bulging of the disc. Mild stenosis of both lateral recesses but without definite L5 nerve compression. There is a left anterior to lateral disc herniation which was not present at the time of the prior examination. This is likely to be painful. As noted above, there is discogenic endplate marrow edema associated with this. This could cause not only back pain, but the left L4 nerve could be affected in an extraforaminal location.   L5-S1: Chronic fusion across this disc space. Wide patency of the canal and foramina.   IMPRESSION: 1. L3-4: Facet arthropathy. 2 mm of anterolisthesis. Bulging of the disc. Stenosis of both lateral recesses that could cause neural compression on either or both sides. Slight worsening since 2021.   2. L4-5: Facet arthropathy. 2 mm of anterolisthesis. Bulging of the disc. Mild stenosis of both lateral recesses but without definite L5 nerve compression. There is  a left anterior to lateral disc herniation which was not present at the time of the prior examination. This is likely to be painful. This could cause not only back pain, but the left L4 nerve could be affected in an extraforaminal location.   3. L5-S1: Chronic fusion across the disc space. Wide patency of the canal and foramina.     Electronically Signed   By: Oneil Officer M.D.   On: 08/09/2023 15:55     Objective:  VS:  HT:    WT:   BMI:     BP:(!) 152/87  HR:75bpm  TEMP: ( )  RESP:  Physical Exam Vitals and nursing note reviewed.  Constitutional:      General: He is not in acute distress.    Appearance: Normal appearance. He is not ill-appearing.  HENT:     Head: Normocephalic and atraumatic.     Right Ear: External ear normal.     Left Ear: External ear normal.     Nose: No congestion.  Eyes:     Extraocular Movements: Extraocular movements intact.  Cardiovascular:     Rate and Rhythm: Normal rate.     Pulses: Normal pulses.  Pulmonary:     Effort: Pulmonary effort is normal. No respiratory distress.  Abdominal:     General: There is no distension.     Palpations: Abdomen is soft.  Musculoskeletal:  General: No tenderness or signs of injury.     Cervical back: Neck supple.     Right lower leg: No edema.     Left lower leg: No edema.     Comments: Patient has good distal strength without clonus.  Skin:    Findings: No erythema or rash.  Neurological:     General: No focal deficit present.     Mental Status: He is alert and oriented to person, place, and time.     Sensory: No sensory deficit.     Motor: No weakness or abnormal muscle tone.     Coordination: Coordination normal.  Psychiatric:        Mood and Affect: Mood normal.        Behavior: Behavior normal.      Imaging: No results found.

## 2024-02-12 ENCOUNTER — Other Ambulatory Visit: Payer: Self-pay

## 2024-02-12 ENCOUNTER — Encounter (HOSPITAL_BASED_OUTPATIENT_CLINIC_OR_DEPARTMENT_OTHER): Payer: Self-pay

## 2024-02-12 ENCOUNTER — Emergency Department (HOSPITAL_BASED_OUTPATIENT_CLINIC_OR_DEPARTMENT_OTHER)

## 2024-02-12 ENCOUNTER — Emergency Department (HOSPITAL_BASED_OUTPATIENT_CLINIC_OR_DEPARTMENT_OTHER): Admitting: Radiology

## 2024-02-12 ENCOUNTER — Emergency Department (HOSPITAL_BASED_OUTPATIENT_CLINIC_OR_DEPARTMENT_OTHER): Admission: EM | Admit: 2024-02-12 | Discharge: 2024-02-13 | Disposition: A

## 2024-02-12 DIAGNOSIS — W19XXXA Unspecified fall, initial encounter: Secondary | ICD-10-CM | POA: Insufficient documentation

## 2024-02-12 DIAGNOSIS — S0240CA Maxillary fracture, right side, initial encounter for closed fracture: Secondary | ICD-10-CM | POA: Insufficient documentation

## 2024-02-12 DIAGNOSIS — G629 Polyneuropathy, unspecified: Secondary | ICD-10-CM | POA: Insufficient documentation

## 2024-02-12 DIAGNOSIS — S0181XA Laceration without foreign body of other part of head, initial encounter: Secondary | ICD-10-CM | POA: Insufficient documentation

## 2024-02-12 DIAGNOSIS — S2241XA Multiple fractures of ribs, right side, initial encounter for closed fracture: Secondary | ICD-10-CM | POA: Insufficient documentation

## 2024-02-12 DIAGNOSIS — S299XXA Unspecified injury of thorax, initial encounter: Secondary | ICD-10-CM | POA: Diagnosis present

## 2024-02-12 DIAGNOSIS — S02401A Maxillary fracture, unspecified, initial encounter for closed fracture: Secondary | ICD-10-CM

## 2024-02-12 DIAGNOSIS — Z79899 Other long term (current) drug therapy: Secondary | ICD-10-CM | POA: Insufficient documentation

## 2024-02-12 NOTE — ED Triage Notes (Signed)
 Patient had a mechanical fall at his son's house and hit his head. He is not on any anticoagulants, and he did not lose consciousness. He does have a small right eyebrow laceration with swelling and ecchymosis as well as a slight abrasion on the left eyebrow.

## 2024-02-13 ENCOUNTER — Telehealth: Payer: Self-pay | Admitting: Physical Medicine and Rehabilitation

## 2024-02-13 DIAGNOSIS — S2241XA Multiple fractures of ribs, right side, initial encounter for closed fracture: Secondary | ICD-10-CM | POA: Diagnosis not present

## 2024-02-13 MED ORDER — METHOCARBAMOL 500 MG PO TABS: 500.0000 mg | ORAL_TABLET | Freq: Three times a day (TID) | ORAL | 0 refills | Status: AC | PRN

## 2024-02-13 MED ORDER — HYDROCODONE-ACETAMINOPHEN 5-325 MG PO TABS: 1.0000 | ORAL_TABLET | Freq: Four times a day (QID) | ORAL | 0 refills | Status: AC | PRN

## 2024-02-13 MED ORDER — FENTANYL CITRATE (PF) 50 MCG/ML IJ SOSY
50.0000 ug | PREFILLED_SYRINGE | Freq: Once | INTRAMUSCULAR | Status: AC
  Administered 2024-02-13: 50 ug via INTRAVENOUS
  Filled 2024-02-13: qty 1

## 2024-02-13 NOTE — Telephone Encounter (Signed)
 Patient called to report that he fell and broke 3 ribs and fractured a bone above the R eye. CB#[phone]

## 2024-02-13 NOTE — Discharge Instructions (Addendum)
 Make sure you are doing your incentive spirometer hourly.  You can take your Norco as needed for pain and your Robaxin  as needed for pain in between doses of Norco.  Both medicines can make you sleepy.  You can also take ibuprofen as needed for pain.  Please return to the ER for any new or worsening symptoms especially cough, shortness of breath or fevers.  Follow-up with your primary care doctor in 1 week.  Call the office to make an appointment.

## 2024-02-13 NOTE — ED Provider Notes (Signed)
 [place] EMERGENCY DEPARTMENT AT [facility] Provider Note   CSN: [id number] Arrival date & time: 02/12/24  2121     Patient presents with: Fall, Laceration, and Chest Pain   [name] is a 88 y.o. male.   88 year old male presents for evaluation of fall.  He states he got up, and has neuropathy and was walking and lost his balance and fell.  States he is having bilateral rib pain as well as some facial pain.  Does have some bruising to his face as well.  States he is not on a blood thinner.  States he did not lose consciousness.  Denies any other symptoms or concerns at this time.   Fall Associated symptoms include chest pain. Pertinent negatives include no abdominal pain and no shortness of breath.  Laceration Associated symptoms: no fever and no rash   Chest Pain Associated symptoms: no abdominal pain, no back pain, no cough, no fever, no palpitations, no shortness of breath and no vomiting        Prior to Admission medications   Medication Sig Start Date End Date Taking? Authorizing Provider  HYDROcodone-acetaminophen  (NORCO/VICODIN) 5-325 MG tablet Take 1 tablet by mouth every 6 (six) hours as needed for up to 4 days. 02/13/24 02/17/24 Yes Cristen Bredeson L, DO  methocarbamol  (ROBAXIN ) 500 MG tablet Take 1 tablet (500 mg total) by mouth every 8 (eight) hours as needed for up to 7 days for muscle spasms. 02/13/24 02/20/24 Yes Kalib Bhagat L, DO  chlorthalidone (HYGROTON) 25 MG tablet Take 25 mg by mouth daily. 08/11/16   [provider]  diazepam  (VALIUM ) 5 MG tablet Take 2.5 mg by mouth in the morning and at bedtime.     [provider]  ibuprofen (ADVIL) 200 MG tablet Take 400 mg by mouth in the morning and at bedtime.    [provider]  sertraline (ZOLOFT) 50 MG tablet Take 50 mg by mouth at bedtime. 08/05/19   [provider]  traMADol  (ULTRAM ) 50 MG tablet 1 to 2 tablets every 6 hours as needed for pain control. 09/15/22    Armenta Canning, MD  traZODone (DESYREL) 50 MG tablet Take 50 mg by mouth at bedtime.  07/30/19   [provider]    Allergies: Prednisone, Bee venom, Niaspan [niacin er (antihyperlipidemic)], Cephalexin, and Cymbalta [duloxetine hcl]    Review of Systems  Constitutional:  Negative for chills and fever.  HENT:  Negative for ear pain and sore throat.   Eyes:  Negative for pain and visual disturbance.  Respiratory:  Negative for cough and shortness of breath.   Cardiovascular:  Positive for chest pain. Negative for palpitations.  Gastrointestinal:  Negative for abdominal pain and vomiting.  Genitourinary:  Negative for dysuria and hematuria.  Musculoskeletal:  Negative for arthralgias and back pain.  Skin:  Negative for color change and rash.  Neurological:  Negative for seizures and syncope.  All other systems reviewed and are negative.   Updated Vital Signs BP (!) 158/98 (BP Location: Right Arm)   Pulse 73   Temp 98.3 F (36.8 C) (Oral)   Resp 18   SpO2 96%   Physical Exam Vitals and nursing note reviewed.  Constitutional:      General: He is not in acute distress.    Appearance: He is well-developed. He is not ill-appearing.  HENT:     Head: Normocephalic.     Comments: Less than 1 cm laceration to right eyebrow, there is ecchymosis to  the right face and nose, bleeding controlled at this time Eyes:     Conjunctiva/sclera: Conjunctivae normal.  Cardiovascular:     Rate and Rhythm: Normal rate and regular rhythm.     Heart sounds: No murmur heard. Pulmonary:     Effort: Pulmonary effort is normal. No respiratory distress.     Breath sounds: Normal breath sounds.  Chest:     Chest wall: Tenderness present.     Comments: Bilateral chest wall tenderness to palpation Abdominal:     Palpations: Abdomen is soft.     Tenderness: There is no abdominal tenderness.  Musculoskeletal:        General: No swelling.     Cervical back: Neck supple.  Skin:    General:  Skin is warm and dry.     Capillary Refill: Capillary refill takes less than 2 seconds.  Neurological:     Mental Status: He is alert.  Psychiatric:        Mood and Affect: Mood normal.     (all labs ordered are listed, but only abnormal results are displayed) Labs Reviewed - No data to display  EKG: None  Radiology: CT Cervical Spine Wo Contrast Result Date: 02/12/2024 EXAM: CT CERVICAL SPINE WITHOUT CONTRAST 02/12/2024 10:59:51 PM TECHNIQUE: CT of the cervical spine was performed without the administration of intravenous contrast. Multiplanar reformatted images are provided for review. Automated exposure control, iterative reconstruction, and/or weight based adjustment of the mA/kV was utilized to reduce the radiation dose to as low as reasonably achievable. COMPARISON: None available. CLINICAL HISTORY: fall. FINDINGS: CERVICAL SPINE: BONES AND ALIGNMENT: No acute fracture or traumatic malalignment. C5-C6 anterior cervical discectomy and fusion surgical hardware. DEGENERATIVE CHANGES: Multilevel moderate degenerative changes of the spine with associated bilateral severe osseous neural foraminal stenosis at the C3-C4 level. SOFT TISSUES: No prevertebral soft tissue swelling. IMPRESSION: 1. No acute abnormality of the cervical spine related to the fall. 2. Multilevel moderate degenerative changes of the spine with associated bilateral severe osseous neural foraminal stenosis at the C3-C4 level. 3. C5-C6 ACDF. Electronically signed by: Morgane Naveau MD 02/12/2024 11:24 PM EDT RP Workstation: [id number]   CT Maxillofacial Wo Contrast Result Date: 02/12/2024 EXAM: CT OF THE FACE WITHOUT CONTRAST 02/12/2024 10:59:51 PM TECHNIQUE: CT of the face was performed without the administration of intravenous contrast. Multiplanar reformatted images are provided for review. Automated exposure control, iterative reconstruction, and/or weight based adjustment of the mA/kV was utilized to reduce the radiation  dose to as low as reasonably achievable. COMPARISON: None available. CLINICAL HISTORY: Facial trauma, blunt. Fall. FINDINGS: FACIAL BONES: Acute nondisplaced right posterolateral maxillary sinus wall fracture. Patient is edentulous. No mandibular dislocation. No suspicious bone lesion. ORBITS: Globes are intact. No acute traumatic injury. No inflammatory change. SINUSES AND MASTOIDS: Bilateral sphenoid, right ethmoid, right maxillary sinus mucosal thickening. Hypertrophy of the right sphenoid sinus wall suggestive of chronic sinusitis. Acute nondisplaced right posterolateral maxillary sinus wall fracture. SOFT TISSUES: Right maxillary soft tissue hematoma. IMPRESSION: 1. Acute nondisplaced right posterolateral maxillary sinus wall fracture. 2. Right maxillary soft tissue hematoma. Electronically signed by: Morgane Naveau MD 02/12/2024 11:22 PM EDT RP Workstation: [id number]   CT Chest Wo Contrast Result Date: 02/12/2024 EXAM: CT CHEST WITHOUT CONTRAST 02/12/2024 11:00:17 PM TECHNIQUE: CT of the chest was performed without the administration of intravenous contrast. Multiplanar reformatted images are provided for review. Automated exposure control, iterative reconstruction, and/or weight based adjustment of the mA/kV was utilized to reduce the radiation dose to as low as reasonably  achievable. COMPARISON: Chest x-ray 09/15/2022. CLINICAL HISTORY: Chest trauma, blunt. Bilateral rib pain. FINDINGS: MEDIASTINUM: Heart and pericardium are unremarkable. 4-vessel coronary artery calcification. The central airways are clear. Ascending thoracic aorta enlarged in caliber measuring up to 4.2 cm. Descending thoracic aorta enlarged in caliber measuring up to 3.8 cm. Aortic arch measuring up to 4 cm. Moderate atherosclerotic plaque. LYMPH NODES: No gross hilar adenopathy with limited evaluation on this noncontrast study. No mediastinal or axillary lymphadenopathy. LUNGS AND PLEURA: Punctate and 3 mm left lower lobe and  punctate right middle lobe calcified micronodules. No focal consolidation or pulmonary edema. No pleural effusion or pneumothorax. SOFT TISSUES/BONES: Acute nondisplaced right anterolateral 4, 5, 6, 7, 8 rib fractures. Old healed nondisplaced left rib fractures. Total right shoulder arthroplasty partially visualized. Question left shoulder anchor fracture. Cervical spine surgical hardware. Multilevel degenerative change of the spine. No acute abnormality of the soft tissues. UPPER ABDOMEN: Limited images of the upper abdomen demonstrates colonic diverticulosis. IMPRESSION: 1. Acute nondisplaced right anterolateral 48 rib fractures. 2. 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 Recommend annual imaging followup by CTA or MRA.  This recommendation follows 2010 ACCF/AHA/AATS/ACR/ASA/SCA/SCAI/SIR/STS/SVM Guidelines for the Diagnosis and Management of Patients with Thoracic Aortic Disease. Circulation. 2010; 121: Z733-z630.  Aortic aneurysm NOS (ICD10-I71.9). Electronically signed by: Morgane Naveau MD 02/12/2024 11:19 PM EDT RP Workstation: [id number]   CT Head Wo Contrast Result Date: 02/12/2024 EXAM: CT HEAD WITHOUT CONTRAST 02/12/2024 10:59:51 PM TECHNIQUE: CT of the head was performed without the administration of intravenous contrast. Automated exposure control, iterative reconstruction, and/or weight based adjustment of the mA/kV was utilized to reduce the radiation dose to as low as reasonably achievable. COMPARISON: None available. CLINICAL HISTORY: fall. Fall. FINDINGS: BRAIN AND VENTRICLES: No acute hemorrhage. No evidence of acute infarct. Patchy and confluent areas of decreased attenuation throughout the deep and periventricular white matter of the cerebral hemispheres bilaterally, compatible with chronic microvascular ischemic disease. Cerebral ventricle sizes concordant with the degree of cerebral volume loss. No hydrocephalus. No  extra-axial collection. No mass effect or midline shift. Atherosclerotic calcifications within the cavernous internal carotid and vertebral arteries. ORBITS: Bilateral lens replacement noted. Right periorbital hematoma. Right periorbital contusion. SINUSES: Right maxillary sinus mucosal thickening. SOFT TISSUES AND SKULL: No acute soft tissue abnormality. No skull fracture. IMPRESSION: 1. No acute intracranial abnormality related to the fall. 2. Right periorbital hematoma/contusion. Electronically signed by: Norman Gatlin MD 02/12/2024 11:07 PM EDT RP Workstation: [id number]     .Laceration Repair  Date/Time: 02/13/2024 12:50 AM  Performed by: Gennaro Duwaine CROME, DO Authorized by: Gennaro Duwaine CROME, DO   Consent:    Consent obtained:  Verbal Universal protocol:    Procedure explained and questions answered to patient or proxy's satisfaction: yes     Relevant documents present and verified: yes     Test results available: yes     Imaging studies available: yes   Anesthesia:    Anesthesia method:  None Laceration details:    Location:  Face   Length (cm):  0.5   Depth (mm):  2 Pre-procedure details:    Preparation:  Patient was prepped and draped in usual sterile fashion Exploration:    Limited defect created (wound extended): no     Contaminated: no   Treatment:    Area cleansed with:  Saline   Amount of cleaning:  Standard   Irrigation solution:  Sterile saline   Irrigation method:  Syringe Skin repair:  Repair method:  Tissue adhesive Approximation:    Approximation:  Close Repair type:    Repair type:  Simple Post-procedure details:    Dressing:  Open (no dressing)   Procedure completion:  Tolerated well, no immediate complications    Medications Ordered in the ED  fentaNYL  (SUBLIMAZE ) injection 50 mcg (50 mcg Intravenous Given 02/13/24 0047)                                    Medical Decision Making Cardiac monitor interpretation: Sinus rhythm, no  ectopy  Social determinants of health: Patient lives alone  Patient here for fall.  He has 4 rib fractures on the right as well as a maxillary sinus fracture.  He had an eyebrow laceration that was repaired with glue without difficulty.  We discussed patient's case with Dr. Ann, trauma surgery.  Patient has had appropriate vital signs and oxygen saturation and his pain seems fairly well-controlled at this time.  She states if he pulls well on incentive spirometer he can likely go home with the incentive spirometer remainder with instructions to use it every hour and some pain control rather than admission for ops at this time.  Patient pulled over 2000 on his incentive spirometer.  States he is usually before and he will plan to use it every hour.  Given a small dose of fentanyl  and this seemed to control his pain fairly well.  Advised Tylenol  Motrin as needed I will give prescription for Robaxin  and Norco to use.  Advise close follow-up with primary care and otherwise return to the ER for new or worsening symptoms.  Given very strict return precautions.  He feels comfortable with this plan to be discharged home.  Problems Addressed: Closed fracture of maxillary sinus, unspecified laterality, initial encounter [facility]): acute illness or injury Closed fracture of multiple ribs of right side, initial encounter: acute illness or injury Facial laceration, initial encounter: acute illness or injury Fall, initial encounter: acute illness or injury  Amount and/or Complexity of Data Reviewed External Data Reviewed: notes.    Details: Office visit from 01-12-2024 reviewed and patient has neuropathy Labs: ordered. Decision-making details documented in ED Course.    Details: Ordered and reviewed by me and unremarkable Radiology: ordered and independent interpretation performed. Decision-making details documented in ED Course.    Details: Ordered and reviewed by me: In CT scan show evidence of 4 right sided  rib fractures and maxillary sinus fracture but no other acute abnormality  Risk OTC drugs. Prescription drug management. Parenteral controlled substances. Drug therapy requiring intensive monitoring for toxicity. Decision regarding hospitalization. Diagnosis or treatment significantly limited by social determinants of health. Risk Details: Dr. Ann- trauma surgery - I spoke with her on the phone and she said if patient had good tidal volumes on incentive spirometer could go home with outpatient follow up and pain control      Final diagnoses:  Closed fracture of multiple ribs of right side, initial encounter  Facial laceration, initial encounter  Closed fracture of maxillary sinus, unspecified laterality, initial encounter (HCC)  Fall, initial encounter    ED Discharge Orders          Ordered    methocarbamol  (ROBAXIN ) 500 MG tablet  Every 8 hours PRN        02/13/24 0211    HYDROcodone-acetaminophen  (NORCO/VICODIN) 5-325 MG tablet  Every 6 hours PRN  02/13/24 0211               Gennaro Duwaine CROME, DO 02/13/24 [id number]

## 2024-02-17 ENCOUNTER — Telehealth: Payer: Self-pay | Admitting: Physical Medicine and Rehabilitation

## 2024-02-17 NOTE — Telephone Encounter (Signed)
 Erin from [facility] called and wanted to know if you want to continue services or end them. PT want to know. Due to his falls. CB# [phone]

## 2024-03-21 ENCOUNTER — Ambulatory Visit (INDEPENDENT_AMBULATORY_CARE_PROVIDER_SITE_OTHER): Admitting: Podiatry

## 2024-03-21 DIAGNOSIS — M79671 Pain in right foot: Secondary | ICD-10-CM

## 2024-03-21 DIAGNOSIS — B351 Tinea unguium: Secondary | ICD-10-CM

## 2024-03-21 DIAGNOSIS — M79672 Pain in left foot: Secondary | ICD-10-CM | POA: Diagnosis not present

## 2024-03-21 NOTE — Progress Notes (Signed)
 Patient presents for evaluation and treatment of tenderness and some redness around nails feet.  Tenderness around toes with walking and wearing shoes.  Physical exam:  General appearance: Alert, pleasant, and in no acute distress.  Vascular: Pedal pulses: DP 2/4 B/L, PT 2/4 B/L. Mild edema lower legs bilaterally.  Capillary refill time immediate bilaterally  Neurologic:  Dermatologic:  Nails thickened, disfigured, discolored 1-5 BL with subungual debris.  Redness and hypertrophic nail folds along nail folds bilaterally but no signs of drainage or infection.  Musculoskeletal:     Diagnosis: 1. Painful onychomycotic nails 1 through 5 bilaterally. 2. Pain toes 1 through 5 bilaterally.  Plan: -Debrided onychomycotic nails 1 through 5 bilaterally.  Sharply debrided nails with nail clipper and reduced with a power bur.  Return 3 months RFC

## 2024-05-29 ENCOUNTER — Telehealth: Payer: Self-pay

## 2024-05-29 NOTE — Telephone Encounter (Signed)
 Last injection--02/07/24 % 100 relief/improvement Duration of Relief -3 weeks and patient fell last week in October and pain started after that. Current pain score-10 Patient went to ED and had multiple rib fractures and facial trauma and fractures. Pain is the same location and same pain as last time . Pain is in lower back only.

## 2024-06-05 ENCOUNTER — Ambulatory Visit: Admitting: Physical Medicine and Rehabilitation
